# Patient Record
Sex: Female | Born: 1960 | Race: White | Hispanic: No | Marital: Married | Smoking: Never smoker
Health system: Southern US, Community
[De-identification: ages and names within clinical notes are randomized; demographics above are authoritative.]

## PROBLEM LIST (undated history)

## (undated) DIAGNOSIS — F4310 Post-traumatic stress disorder, unspecified: Secondary | ICD-10-CM

## (undated) DIAGNOSIS — F419 Anxiety disorder, unspecified: Secondary | ICD-10-CM

## (undated) DIAGNOSIS — R3915 Urgency of urination: Secondary | ICD-10-CM

## (undated) DIAGNOSIS — E119 Type 2 diabetes mellitus without complications: Secondary | ICD-10-CM

## (undated) DIAGNOSIS — F32A Depression, unspecified: Secondary | ICD-10-CM

## (undated) DIAGNOSIS — N3010 Interstitial cystitis (chronic) without hematuria: Secondary | ICD-10-CM

## (undated) DIAGNOSIS — I10 Essential (primary) hypertension: Secondary | ICD-10-CM

## (undated) DIAGNOSIS — F319 Bipolar disorder, unspecified: Secondary | ICD-10-CM

## (undated) DIAGNOSIS — F329 Major depressive disorder, single episode, unspecified: Secondary | ICD-10-CM

## (undated) DIAGNOSIS — R350 Frequency of micturition: Secondary | ICD-10-CM

## (undated) HISTORY — PX: COLONOSCOPY: SHX174

## (undated) HISTORY — PX: BLADDER SURGERY: SHX569

---

## 2003-05-03 ENCOUNTER — Other Ambulatory Visit: Admission: RE | Admit: 2003-05-03 | Discharge: 2003-05-03 | Payer: Self-pay | Admitting: Internal Medicine

## 2003-06-29 ENCOUNTER — Encounter: Admission: RE | Admit: 2003-06-29 | Discharge: 2003-06-29 | Payer: Self-pay | Admitting: Internal Medicine

## 2003-06-29 ENCOUNTER — Encounter: Payer: Self-pay | Admitting: Internal Medicine

## 2003-09-24 ENCOUNTER — Encounter (INDEPENDENT_AMBULATORY_CARE_PROVIDER_SITE_OTHER): Payer: Self-pay | Admitting: Specialist

## 2003-09-24 ENCOUNTER — Ambulatory Visit (HOSPITAL_BASED_OUTPATIENT_CLINIC_OR_DEPARTMENT_OTHER): Admission: RE | Admit: 2003-09-24 | Discharge: 2003-09-24 | Payer: Self-pay | Admitting: Urology

## 2003-09-24 ENCOUNTER — Ambulatory Visit (HOSPITAL_COMMUNITY): Admission: RE | Admit: 2003-09-24 | Discharge: 2003-09-24 | Payer: Self-pay | Admitting: Urology

## 2004-10-11 ENCOUNTER — Other Ambulatory Visit: Admission: RE | Admit: 2004-10-11 | Discharge: 2004-10-11 | Payer: Self-pay | Admitting: Internal Medicine

## 2004-10-31 ENCOUNTER — Encounter: Admission: RE | Admit: 2004-10-31 | Discharge: 2004-10-31 | Payer: Self-pay | Admitting: Internal Medicine

## 2005-11-20 ENCOUNTER — Other Ambulatory Visit: Admission: RE | Admit: 2005-11-20 | Discharge: 2005-11-20 | Payer: Self-pay | Admitting: Obstetrics and Gynecology

## 2005-12-25 ENCOUNTER — Encounter: Admission: RE | Admit: 2005-12-25 | Discharge: 2005-12-25 | Payer: Self-pay | Admitting: Internal Medicine

## 2006-05-17 ENCOUNTER — Ambulatory Visit: Payer: Self-pay | Admitting: Gastroenterology

## 2007-01-28 ENCOUNTER — Other Ambulatory Visit: Admission: RE | Admit: 2007-01-28 | Discharge: 2007-01-28 | Payer: Self-pay | Admitting: Obstetrics and Gynecology

## 2007-02-14 ENCOUNTER — Encounter: Admission: RE | Admit: 2007-02-14 | Discharge: 2007-02-14 | Payer: Self-pay | Admitting: Obstetrics and Gynecology

## 2008-03-10 ENCOUNTER — Encounter: Admission: RE | Admit: 2008-03-10 | Discharge: 2008-03-10 | Payer: Self-pay | Admitting: Obstetrics and Gynecology

## 2009-04-27 ENCOUNTER — Encounter: Admission: RE | Admit: 2009-04-27 | Discharge: 2009-04-27 | Payer: Self-pay | Admitting: Internal Medicine

## 2010-04-28 ENCOUNTER — Encounter: Admission: RE | Admit: 2010-04-28 | Discharge: 2010-04-28 | Payer: Self-pay | Admitting: Internal Medicine

## 2011-05-18 NOTE — Op Note (Signed)
   NAME:  Loretta Wu, Loretta Wu                        ACCOUNT NO.:  [id number]   MEDICAL RECORD NO.:  [id number]                   PATIENT TYPE:  AMB   LOCATION:  [facility]                                 FACILITY:  [facility]   PHYSICIAN:  Sigmund I. Patsi Sears, M.D.         DATE OF BIRTH:  [birth year]   DATE OF PROCEDURE:  09/24/2003  DATE OF DISCHARGE:                                 OPERATIVE REPORT   PREOPERATIVE DIAGNOSIS:  Interstitial cystitis.   POSTOPERATIVE DIAGNOSIS:  Interstitial cystitis.   OPERATION:  1. Cystourethroscopy.  2. Hydrodistention of the bladder (350 mL).  3. Cold cup bladder biopsies with cauterization.  4. Instillation of Clorpactin, five minutes.  5. Instillation of Pyridium/Marcaine solution and injection of     Marcaine/Kenalog solution, B&O suppository, IV Toradol.   SURGEON:  Sigmund I. Patsi Sears, M.D.   ASSISTANT:  Terri Piedra, N.P.   PREPARATION:  After appropriate preanesthesia, the patient is brought to the  operating room and placed on the operating table in the dorsal supine  position where general LMA anesthesia was introduced.  She was then re-  placed in the dorsal lithotomy position where the pubis was prepped with  Betadine solution and draped in the usual fashion.   DESCRIPTION OF PROCEDURE:  Cystourethroscopy was accomplished using the 21  French cystoscope.  Hydrodistention was accomplished to 350 mL, and  cystoscopy showed multiple ulcerations within the bladder, which were  photodocumented.  Biopsies were taken of the bladder, and the areas  cauterized.  Following this, Clorpactin was inserted for five minutes and  following this, the Clorpactin was drained, and Pyridium/Marcaine solution  was placed in the bladder, and Marcaine/Kenalog solution was injected into  the subtrigonal space.  She was given a B&O suppository prior to the  beginning of the procedure and IV Toradol at the end of the procedure.  She  was awakened and taken to the  recovery room in good condition.                                               Sigmund I. Patsi Sears, M.D.    SIT/MEDQ  D:  09/24/2003  T:  09/24/2003  Job:  [id number]

## 2012-01-07 ENCOUNTER — Other Ambulatory Visit: Payer: Self-pay | Admitting: Internal Medicine

## 2012-01-07 DIAGNOSIS — Z1231 Encounter for screening mammogram for malignant neoplasm of breast: Secondary | ICD-10-CM

## 2012-01-11 ENCOUNTER — Ambulatory Visit: Payer: Self-pay

## 2012-02-01 ENCOUNTER — Ambulatory Visit: Payer: Self-pay

## 2012-03-07 ENCOUNTER — Ambulatory Visit: Payer: Self-pay

## 2012-04-15 ENCOUNTER — Ambulatory Visit
Admission: RE | Admit: 2012-04-15 | Discharge: 2012-04-15 | Disposition: A | Discharge status: Home / Self Care | Payer: 59 | Source: Ambulatory Visit | Attending: Internal Medicine | Admitting: Internal Medicine

## 2012-04-15 DIAGNOSIS — Z1231 Encounter for screening mammogram for malignant neoplasm of breast: Secondary | ICD-10-CM

## 2012-05-16 ENCOUNTER — Other Ambulatory Visit: Payer: Self-pay | Admitting: *Deleted

## 2012-05-16 ENCOUNTER — Other Ambulatory Visit (HOSPITAL_COMMUNITY)
Admission: RE | Admit: 2012-05-16 | Discharge: 2012-05-16 | Disposition: A | Discharge status: Home / Self Care | Payer: 59 | Source: Ambulatory Visit | Attending: Internal Medicine | Admitting: Internal Medicine

## 2012-05-16 DIAGNOSIS — Z1159 Encounter for screening for other viral diseases: Secondary | ICD-10-CM | POA: Insufficient documentation

## 2012-05-16 DIAGNOSIS — Z01419 Encounter for gynecological examination (general) (routine) without abnormal findings: Secondary | ICD-10-CM | POA: Insufficient documentation

## 2012-05-17 ENCOUNTER — Encounter (HOSPITAL_BASED_OUTPATIENT_CLINIC_OR_DEPARTMENT_OTHER): Payer: Self-pay | Admitting: *Deleted

## 2012-05-17 ENCOUNTER — Emergency Department (HOSPITAL_BASED_OUTPATIENT_CLINIC_OR_DEPARTMENT_OTHER)
Admission: EM | Admit: 2012-05-17 | Discharge: 2012-05-17 | Disposition: A | Discharge status: Home / Self Care | Payer: 59 | Attending: Emergency Medicine | Admitting: Emergency Medicine

## 2012-05-17 DIAGNOSIS — F329 Major depressive disorder, single episode, unspecified: Secondary | ICD-10-CM | POA: Insufficient documentation

## 2012-05-17 DIAGNOSIS — L02419 Cutaneous abscess of limb, unspecified: Secondary | ICD-10-CM | POA: Insufficient documentation

## 2012-05-17 DIAGNOSIS — I10 Essential (primary) hypertension: Secondary | ICD-10-CM | POA: Insufficient documentation

## 2012-05-17 DIAGNOSIS — F3289 Other specified depressive episodes: Secondary | ICD-10-CM | POA: Insufficient documentation

## 2012-05-17 DIAGNOSIS — L0291 Cutaneous abscess, unspecified: Secondary | ICD-10-CM

## 2012-05-17 HISTORY — DX: Depression, unspecified: F32.A

## 2012-05-17 HISTORY — DX: Essential (primary) hypertension: I10

## 2012-05-17 HISTORY — DX: Urgency of urination: R39.15

## 2012-05-17 HISTORY — DX: Major depressive disorder, single episode, unspecified: F32.9

## 2012-05-17 HISTORY — DX: Anxiety disorder, unspecified: F41.9

## 2012-05-17 HISTORY — DX: Frequency of micturition: R35.0

## 2012-05-17 HISTORY — DX: Interstitial cystitis (chronic) without hematuria: N30.10

## 2012-05-17 MED ORDER — SULFAMETHOXAZOLE-TRIMETHOPRIM 800-160 MG PO TABS: 1.0000 | ORAL_TABLET | Freq: Two times a day (BID) | ORAL | Status: AC

## 2012-05-17 MED ORDER — LIDOCAINE HCL 2 % IJ SOLN
INTRAMUSCULAR | Status: AC
  Administered 2012-05-17: 23:00:00
  Filled 2012-05-17: qty 1

## 2012-05-17 MED ORDER — HYDROCODONE-ACETAMINOPHEN 5-500 MG PO TABS: 1.0000 | ORAL_TABLET | Freq: Four times a day (QID) | ORAL | Status: AC | PRN

## 2012-05-17 NOTE — Discharge Instructions (Signed)
Keep area very clean. Take motrin as need. Take antibiotic as prescribed.  You may also take vicodin as need for pain. No driving  when taking vicodin. Also, do not take tylenol or acetaminophen containing medication when taking vicodin. Follow up with primary care doctor, or here, Monday morning for recheck of wound and possible packing removal. Return to ER right away if worse, spreading redness, severe pain, high fevers, other concern.   Your blood pressure is high today - follow up with primary care doctor for recheck in coming week.       Abscess An abscess (boil or furuncle) is an infected area that contains a collection of pus.  SYMPTOMS Signs and symptoms of an abscess include pain, tenderness, redness, or hardness. You may feel a moveable soft area under your skin. An abscess can occur anywhere in the body.  TREATMENT  A surgical cut (incision) may be made over your abscess to drain the pus. Gauze may be packed into the space or a drain may be looped through the abscess cavity (pocket). This provides a drain that will allow the cavity to heal from the inside outwards. The abscess may be painful for a few days, but should feel much better if it was drained.  Your abscess, if seen early, may not have localized and may not have been drained. If not, another appointment may be required if it does not get better on its own or with medications. HOME CARE INSTRUCTIONS   Only take over-the-counter or prescription medicines for pain, discomfort, or fever as directed by your caregiver.   Take your antibiotics as directed if they were prescribed. Finish them even if you start to feel better.   Keep the skin and clothes clean around your abscess.   If the abscess was drained, you will need to use gauze dressing to collect any draining pus. Dressings will typically need to be changed 3 or more times a day.   The infection may spread by skin contact with others. Avoid skin contact as much as  possible.   Practice good hygiene. This includes regular hand washing, cover any draining skin lesions, and do not share personal care items.   If you participate in sports, do not share athletic equipment, towels, whirlpools, or personal care items. Shower after every practice or tournament.   If a draining area cannot be adequately covered:   Do not participate in sports.   Children should not participate in day care until the wound has healed or drainage stops.   If your caregiver has given you a follow-up appointment, it is very important to keep that appointment. Not keeping the appointment could result in a much worse infection, chronic or permanent injury, pain, and disability. If there is any problem keeping the appointment, you must call back to this facility for assistance.  SEEK MEDICAL CARE IF:   You develop increased pain, swelling, redness, drainage, or bleeding in the wound site.   You develop signs of generalized infection including muscle aches, chills, fever, or a general ill feeling.   You have an oral temperature above 102 F (38.9 C).  MAKE SURE YOU:   Understand these instructions.   Will watch your condition.   Will get help right away if you are not doing well or get worse.  Document Released: 09/26/2005 Document Revised: 12/06/2011 Document Reviewed: 07/20/2008 [facility] Patient Information 2012 [place], [place].

## 2012-05-17 NOTE — ED Notes (Signed)
Pt presents to ED with an approx 6 in. In diameter raised reddened area to the right upper leg.

## 2012-05-17 NOTE — ED Provider Notes (Signed)
History   This chart was scribed for Suzi Roots, MD by Shari Heritage. The patient was seen in room MH09/MH09. Patient's care was started at 2101.     CSN: [id number]  Arrival date & time 05/17/12  2101   First MD Initiated Contact with Patient 05/17/12 2150      Chief Complaint  Patient presents with  . Insect Bite    (Consider location/radiation/quality/duration/timing/severity/associated sxs/prior treatment) The history is provided by the patient.   Loretta Wu is a 51 y.o. female who presents to the Emergency Department complaining of an mild, dull, persistent pain in right thigh associated with an insect bite to the lateral aspect of the right thigh. Other associated symptoms are headache and erythema around bite site. Patient first noticed the insect bite 2 days ago. Pain worsened today which prompted visit to ED. Patient reported no specific aggravating factors. Pt w/o history of abscesses or boils. Patient with h/o of multiple skin infections that were never drained. Pt saw a nurse practitioner one day ago who prescribed an antibiotic that pt started this morning.  No vomiting or nausea. No edema in right thigh. No cough, congestion, SOB, or rhinorrhea. No fever or chills. Pt is ambulatory.  Pt also with h/o of HTN, anxiety, urgency of urination and frequency of urination.  Past Medical History  Diagnosis Date  . Interstitial cystitis   . Hypertension   . Anxiety   . Depression   . Urgency of urination   . Frequency of urination     Past Surgical History  Procedure Date  . Bladder surgery     History reviewed. No pertinent family history.  History  Substance Use Topics  . Smoking status: Never Smoker   . Smokeless tobacco: Not on file  . Alcohol Use: No    OB History    Grav Para Term Preterm Abortions TAB SAB Ect Mult Living                  Review of Systems  Constitutional: Negative for fever and chills.  HENT: Negative for congestion and  rhinorrhea.   Respiratory: Negative for cough and shortness of breath.   Gastrointestinal: Negative for nausea and vomiting.  Musculoskeletal:       Pain in right thigh.  Skin: Positive for color change (Erythema on right thigh.).  Neurological: Positive for headaches.    Allergies  Review of patient's allergies indicates no known allergies.  Home Medications   Current Outpatient Rx  Name Route Sig Dispense Refill  . ACETAMINOPHEN 500 MG PO TABS Oral Take 500 mg by mouth once. For pain    . AMOXICILLIN-POT CLAVULANATE 875-125 MG PO TABS Oral Take 1 tablet by mouth 2 (two) times daily.    Marland Kitchen CALCIUM + D PO Oral Take 1 tablet by mouth daily.    Marland Kitchen MIRABEGRON ER 50 MG PO TB24 Oral Take by mouth daily.    Marland Kitchen NITROFURANTOIN MACROCRYSTAL 50 MG PO CAPS Oral Take 50 mg by mouth daily.    Marland Kitchen OLMESARTAN MEDOXOMIL 20 MG PO TABS Oral Take 20 mg by mouth daily.    . SERTRALINE HCL 50 MG PO TABS Oral Take 50 mg by mouth daily.      BP 172/80  Pulse 119  Temp(Src) 99.2 F (37.3 C) (Oral)  Resp 20  Ht 5\' 6"  (1.676 m)  Wt 230 lb (104.327 kg)  BMI 37.12 kg/m2  SpO2 100%  LMP 05/17/2010  Physical Exam  Nursing note  and vitals reviewed. Constitutional: She is oriented to person, place, and time. She appears well-developed and well-nourished.  HENT:  Head: Normocephalic and atraumatic.  Eyes: Conjunctivae and EOM are normal. Pupils are equal, round, and reactive to light.  Neck: Normal range of motion. Neck supple.  Cardiovascular: Normal rate and regular rhythm.   Pulmonary/Chest: Effort normal and breath sounds normal.  Abdominal: Soft. Bowel sounds are normal.  Musculoskeletal: Normal range of motion.  Neurological: She is alert and oriented to person, place, and time.  Skin: Skin is warm and dry. There is erythema.       Left erythema to right upper thigh that is 10-12 cm in diameter. Few tiny erythematous lesions less than 1 cm in diameter on left leg.  Psychiatric: She has a normal  mood and affect.    ED Course  Procedures (including critical care time) DIAGNOSTIC STUDIES: Oxygen Saturation is 100% room air, normal by my interpretation.    COORDINATION OF CARE: 10:11PM- Patient informed of current plan for treatment and evaluation and agrees with plan at this time.       MDM  Pt with abscess right lateral/posterior upper leg.    INCISION AND DRAINAGE Performed by: Suzi Roots Consent: Verbal consent obtained. Risks and benefits: risks, benefits and alternatives were discussed Type: abscess  Body area: right upper leg/thigh  Anesthesia: local infiltration  Local anesthetic: lidocaine 2% w epinephrine  Anesthetic total: 7 ml  Complexity: complex Blunt dissection to break up loculations  Drainage: purulent  Drainage amount: large  Packing material: 1/4 in iodoform gauze  Patient tolerance: Patient tolerated the procedure well with no immediate complications.      Sterile dressing. Confirmed nkda w pt.  Bactrim ds.   Recheck hr 92 rr 16 no chills or sweats.              I personally performed the services described in this documentation, which was scribed in my presence. The recorded information has been reviewed and considered. Suzi Roots, MD    Suzi Roots, MD 05/17/12 [phone]

## 2012-05-17 NOTE — ED Notes (Signed)
Pt reports noticing an insect bite to the lateral side of her right thigh. States that she went to her GYN for her annual check up and the PA prescribed her an antibiotic for it yesterday. States that site became larger and more inflamed, so pt came to the ER for further evaluation. SIte is inflamed, hot to the touch with induration at the center. Pt reports having frequent skin infections.

## 2012-05-17 NOTE — ED Notes (Signed)
Dr. Denton Lank at bedside performing I&D on pt's right outer thigh

## 2013-10-30 ENCOUNTER — Other Ambulatory Visit: Payer: Self-pay

## 2013-10-30 DIAGNOSIS — Z1231 Encounter for screening mammogram for malignant neoplasm of breast: Secondary | ICD-10-CM

## 2013-12-02 ENCOUNTER — Ambulatory Visit
Admission: RE | Admit: 2013-12-02 | Discharge: 2013-12-02 | Disposition: A | Discharge status: Home / Self Care | Payer: 59 | Source: Ambulatory Visit

## 2013-12-02 DIAGNOSIS — Z1231 Encounter for screening mammogram for malignant neoplasm of breast: Secondary | ICD-10-CM

## 2014-01-11 ENCOUNTER — Other Ambulatory Visit: Payer: Self-pay | Admitting: Dermatology

## 2014-01-11 ENCOUNTER — Other Ambulatory Visit: Payer: 59

## 2014-01-11 ENCOUNTER — Ambulatory Visit
Admission: RE | Admit: 2014-01-11 | Discharge: 2014-01-11 | Disposition: A | Discharge status: Home / Self Care | Payer: 59 | Source: Ambulatory Visit | Attending: Dermatology | Admitting: Dermatology

## 2014-01-11 DIAGNOSIS — I8312 Varicose veins of left lower extremity with inflammation: Secondary | ICD-10-CM

## 2014-01-11 DIAGNOSIS — L539 Erythematous condition, unspecified: Secondary | ICD-10-CM

## 2014-09-23 ENCOUNTER — Emergency Department (HOSPITAL_BASED_OUTPATIENT_CLINIC_OR_DEPARTMENT_OTHER)
Admission: EM | Admit: 2014-09-23 | Discharge: 2014-09-23 | Disposition: A | Discharge status: Home / Self Care | Payer: 59 | Attending: Emergency Medicine | Admitting: Emergency Medicine

## 2014-09-23 ENCOUNTER — Encounter (HOSPITAL_BASED_OUTPATIENT_CLINIC_OR_DEPARTMENT_OTHER): Payer: Self-pay | Admitting: Emergency Medicine

## 2014-09-23 ENCOUNTER — Emergency Department (HOSPITAL_BASED_OUTPATIENT_CLINIC_OR_DEPARTMENT_OTHER): Payer: 59

## 2014-09-23 DIAGNOSIS — Z87448 Personal history of other diseases of urinary system: Secondary | ICD-10-CM | POA: Diagnosis not present

## 2014-09-23 DIAGNOSIS — F329 Major depressive disorder, single episode, unspecified: Secondary | ICD-10-CM | POA: Insufficient documentation

## 2014-09-23 DIAGNOSIS — M5412 Radiculopathy, cervical region: Secondary | ICD-10-CM | POA: Diagnosis not present

## 2014-09-23 DIAGNOSIS — F411 Generalized anxiety disorder: Secondary | ICD-10-CM | POA: Diagnosis not present

## 2014-09-23 DIAGNOSIS — I10 Essential (primary) hypertension: Secondary | ICD-10-CM | POA: Insufficient documentation

## 2014-09-23 DIAGNOSIS — F3289 Other specified depressive episodes: Secondary | ICD-10-CM | POA: Diagnosis not present

## 2014-09-23 DIAGNOSIS — M25519 Pain in unspecified shoulder: Secondary | ICD-10-CM | POA: Diagnosis present

## 2014-09-23 DIAGNOSIS — Z792 Long term (current) use of antibiotics: Secondary | ICD-10-CM | POA: Diagnosis not present

## 2014-09-23 DIAGNOSIS — Z79899 Other long term (current) drug therapy: Secondary | ICD-10-CM | POA: Insufficient documentation

## 2014-09-23 MED ORDER — HYDROCODONE-ACETAMINOPHEN 5-325 MG PO TABS: 1.0000 | ORAL_TABLET | Freq: Four times a day (QID) | ORAL | Status: DC | PRN

## 2014-09-23 MED ORDER — HYDROCODONE-ACETAMINOPHEN 5-325 MG PO TABS
2.0000 | ORAL_TABLET | Freq: Once | ORAL | Status: AC
  Administered 2014-09-23: 2 via ORAL
  Filled 2014-09-23: qty 2

## 2014-09-23 NOTE — ED Notes (Signed)
Pt c/o lt shoulder pain since yesterday, denies injury, states feels like muscle, worse tonight

## 2014-09-23 NOTE — Discharge Instructions (Signed)

## 2014-09-23 NOTE — ED Provider Notes (Addendum)
CSN: [id number]     Arrival date & time 09/23/14  4098 History   First MD Initiated Contact with Patient 09/23/14 0533     Chief Complaint  Patient presents with  . Shoulder Pain     (Consider location/radiation/quality/duration/timing/severity/associated sxs/prior Treatment) HPI This is a 53 year old female with pain in the left neck and shoulder since about 2 PM yesterday afternoon. She denies injury or lifting as a trigger. The pain is located in the left side of the neck radiating across the back of the shoulder laterally and inferiorly. The pain is sharp and moderate to severe at rest. It is worse with rotation of the head to the left, flexion of the head to the left or palpation of the left neck or posterior shoulder. She has had some radiation to the left deltoid region with occasional paresthesias of the left deltoid region. She denies numbness or weakness of the distal left upper extremity.  Past Medical History  Diagnosis Date  . Interstitial cystitis   . Hypertension   . Anxiety   . Depression   . Urgency of urination   . Frequency of urination    Past Surgical History  Procedure Laterality Date  . Bladder surgery     No family history on file. History  Substance Use Topics  . Smoking status: Never Smoker   . Smokeless tobacco: Not on file  . Alcohol Use: No   OB History   Grav Para Term Preterm Abortions TAB SAB Ect Mult Living                 Review of Systems  All other systems reviewed and are negative.   Allergies  Review of patient's allergies indicates no known allergies.  Home Medications   Prior to Admission medications   Medication Sig Start Date End Date Taking? Authorizing Provider  acetaminophen (TYLENOL) 500 MG tablet Take 500 mg by mouth once. For pain   Yes Historical Provider, MD  Calcium Carbonate-Vitamin D (CALCIUM + D PO) Take 1 tablet by mouth daily.   Yes Historical Provider, MD  nitrofurantoin (MACRODANTIN) 50 MG capsule Take 50 mg  by mouth daily.   Yes Historical Provider, MD  sertraline (ZOLOFT) 50 MG tablet Take 50 mg by mouth daily.   Yes Historical Provider, MD  valsartan (DIOVAN) 160 MG tablet Take 160 mg by mouth daily.   Yes Historical Provider, MD  amoxicillin-clavulanate (AUGMENTIN) 875-125 MG per tablet Take 1 tablet by mouth 2 (two) times daily.    Historical Provider, MD  mirabegron ER (MYRBETRIQ) 50 MG TB24 Take by mouth daily.    Historical Provider, MD  olmesartan (BENICAR) 20 MG tablet Take 20 mg by mouth daily.    Historical Provider, MD   BP 170/85  Pulse 84  Temp(Src) 98.7 F (37.1 C) (Oral)  Resp 20  Ht 5' 6.5" (1.689 m)  Wt 249 lb (112.946 kg)  BMI 39.59 kg/m2  SpO2 100%  LMP 05/17/2010  Physical Exam General: Well-developed, well-nourished female in no acute distress; appearance consistent with age of record HENT: normocephalic; atraumatic Eyes: pupils equal, round and reactive to light; extraocular muscles intact Neck: supple; pain on rotation of head to the left, unable to flex head to the left due to pain, soft tissue tenderness of left side of neck and left trapezius Heart: regular rate and rhythm Lungs: clear to auscultation bilaterally Abdomen: soft; nondistended Extremities: No deformity; full range of motion; pulses normal Neurologic: Awake, alert and oriented; motor function  intact in all extremities and symmetric; sensation intact and symmetric in upper extremities; no facial droop Skin: Warm and dry Psychiatric: Normal mood and affect    ED Course  Procedures (including critical care time)  MDM  Nursing notes and vitals signs, including pulse oximetry, reviewed.  Summary of this visit's results, reviewed by myself:  Imaging Studies: Dg Shoulder Left  09/23/2014   CLINICAL DATA:  Left shoulder pain.  No known injury.  EXAM: LEFT SHOULDER - 2+ VIEW  COMPARISON:  None.  FINDINGS: There is no evidence of fracture or dislocation. There is no evidence of arthropathy or  other focal bone abnormality. Soft tissues are unremarkable.  IMPRESSION: Negative.   Electronically Signed   By: Tiburcio Pea M.D.   On: 09/23/2014 05:54      History and exam consistent with a left cervical radiculopathy. She was advised to take over-the-counter NSAID such as ibuprofen or naproxen sodium and we will provide a short course of narcotics.    Hanley Seamen, MD 09/23/14 1610  Hanley Seamen, MD 09/23/14 9604  Hanley Seamen, MD 09/23/14 [phone]

## 2014-10-27 ENCOUNTER — Ambulatory Visit: Payer: 59 | Attending: Internal Medicine | Admitting: Physical Therapy

## 2014-10-27 DIAGNOSIS — M5412 Radiculopathy, cervical region: Secondary | ICD-10-CM | POA: Insufficient documentation

## 2014-10-27 DIAGNOSIS — M419 Scoliosis, unspecified: Secondary | ICD-10-CM | POA: Diagnosis not present

## 2014-10-27 DIAGNOSIS — Z5189 Encounter for other specified aftercare: Secondary | ICD-10-CM | POA: Diagnosis present

## 2014-10-27 DIAGNOSIS — I10 Essential (primary) hypertension: Secondary | ICD-10-CM | POA: Diagnosis not present

## 2014-11-04 ENCOUNTER — Other Ambulatory Visit: Payer: Self-pay

## 2014-11-04 DIAGNOSIS — Z1231 Encounter for screening mammogram for malignant neoplasm of breast: Secondary | ICD-10-CM

## 2014-11-09 ENCOUNTER — Ambulatory Visit: Payer: 59 | Attending: Internal Medicine | Admitting: Rehabilitation

## 2014-11-09 ENCOUNTER — Telehealth: Payer: Self-pay | Admitting: Physical Therapy

## 2014-11-09 DIAGNOSIS — I10 Essential (primary) hypertension: Secondary | ICD-10-CM | POA: Insufficient documentation

## 2014-11-09 DIAGNOSIS — M419 Scoliosis, unspecified: Secondary | ICD-10-CM | POA: Insufficient documentation

## 2014-11-09 DIAGNOSIS — Z5189 Encounter for other specified aftercare: Secondary | ICD-10-CM | POA: Insufficient documentation

## 2014-11-09 DIAGNOSIS — M5412 Radiculopathy, cervical region: Secondary | ICD-10-CM | POA: Insufficient documentation

## 2014-11-09 NOTE — Telephone Encounter (Signed)
Left message for patient to call and cancel future appointments if she does not plan to attend P.T. Also confirmed next appointment date/time.

## 2014-11-15 ENCOUNTER — Ambulatory Visit: Payer: 59 | Admitting: Physical Therapy

## 2014-11-17 ENCOUNTER — Ambulatory Visit: Payer: 59 | Admitting: Rehabilitation

## 2014-11-17 DIAGNOSIS — M5412 Radiculopathy, cervical region: Secondary | ICD-10-CM | POA: Diagnosis not present

## 2014-11-17 DIAGNOSIS — M419 Scoliosis, unspecified: Secondary | ICD-10-CM | POA: Diagnosis not present

## 2014-11-17 DIAGNOSIS — Z5189 Encounter for other specified aftercare: Secondary | ICD-10-CM | POA: Diagnosis present

## 2014-11-17 DIAGNOSIS — I10 Essential (primary) hypertension: Secondary | ICD-10-CM | POA: Diagnosis not present

## 2014-11-17 NOTE — Therapy (Signed)
Physical Therapy Treatment  Patient Details  Name: Loretta Wu MRN: [id number] Date of Birth: [birth year]  Encounter Date: 11/17/2014      PT End of Session - 11/17/14 0819    Visit Number 2   Number of Visits 8   Date for PT Re-Evaluation 12/08/14   PT Start Time 0807   PT Stop Time 0859   PT Time Calculation (min) 52 min      Past Medical History  Diagnosis Date  . Interstitial cystitis   . Hypertension   . Anxiety   . Depression   . Urgency of urination   . Frequency of urination     Past Surgical History  Procedure Laterality Date  . Bladder surgery      LMP 05/17/2010  Visit Diagnosis:  Cervical radiculopathy      Subjective Assessment - 11/17/14 0815    Symptoms Pain varies, 7-8/10 at worst, least 5-6/10. Pt reports taking less meds and is able to sleep better last few days using icy hot.    Currently in Pain? Yes   Pain Score 8    Pain Location Shoulder  posterior shoulder   Pain Orientation Left   Pain Descriptors / Indicators Aching   Pain Onset More than a month ago   Pain Frequency Constant   Aggravating Factors  Sitting still    Pain Relieving Factors pain meds, icy hot   Multiple Pain Sites No            [facility] Adult PT Treatment/Exercise - 11/17/14 0931    Neck Exercises: Stretches   Upper Trapezius Stretch 3 reps;10 seconds   Levator Stretch 3 reps;10 seconds   Neck Exercises: Seated   Neck Retraction 10 reps;5 secs   Shoulder Exercises: Seated   Retraction 10 reps  5 seconds   Modalities   Modalities Electrical Stimulation;Moist Heat   Moist Heat Therapy   Number Minutes Moist Heat 15 Minutes   Moist Heat Location Other (comment)  upper trap   Electrical Stimulation   Electrical Stimulation Location --  upper trap/ levator left   Electrical Stimulation Action --  IFC   Electrical Stimulation Goals Pain   Manual Therapy   Manual Therapy Massage   Massage --  Soft tissue work to left levator, teres, traps          PT Education - 11/17/14 [phone]    Education provided Yes   Education Details Self Care: Sittinf posture and affect it has on pain discussed in detail with patient. Reasoning behind exercises and importance of HEP compliance to maximize results of treatment.   Person(s) Educated Patient   Methods Explanation;Demonstration   Comprehension Verbalized understanding            PT Long Term Goals - 11/17/14 0835    PT LONG TERM GOAL #1   Title demonstrate and or verbalize techniqes to reduce the risk of re-injury to include info on: posture and body mechanics   Time 4   Period Weeks   Status On-going   PT LONG TERM GOAL #2   Title be independent with advanced hep for posture and neck flexibility   Time 4   Period Weeks   Status On-going   PT LONG TERM GOAL #3   Title sit as long as needed with min occ pain Lt. 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 No goals met, limited visit number. Response to todays treatment: pain reduce to 5-6/10 after electrical stim.   PT Next Visit Plan Issue TENS if patient reports benefit, taction trial? (patient does not like to lay down), corner stretch        Problem List There are no active problems to display for this patient.                                             Dorene Ar , PTA  11/17/2014, 9:45 AM

## 2014-11-17 NOTE — Patient Instructions (Signed)
Continue Current HEP for Neck tension exercises. Perform at least 2 times per day. Continue to attempt good posture with job duties.

## 2014-11-22 ENCOUNTER — Ambulatory Visit: Payer: 59 | Admitting: Physical Therapy

## 2014-11-24 ENCOUNTER — Encounter: Payer: 59 | Admitting: Rehabilitation

## 2014-12-03 ENCOUNTER — Ambulatory Visit
Admission: RE | Admit: 2014-12-03 | Discharge: 2014-12-03 | Disposition: A | Discharge status: Home / Self Care | Payer: 59 | Source: Ambulatory Visit

## 2014-12-03 ENCOUNTER — Ambulatory Visit: Payer: 59 | Attending: Internal Medicine | Admitting: Physical Therapy

## 2014-12-03 DIAGNOSIS — M419 Scoliosis, unspecified: Secondary | ICD-10-CM | POA: Diagnosis not present

## 2014-12-03 DIAGNOSIS — I10 Essential (primary) hypertension: Secondary | ICD-10-CM | POA: Insufficient documentation

## 2014-12-03 DIAGNOSIS — Z1231 Encounter for screening mammogram for malignant neoplasm of breast: Secondary | ICD-10-CM

## 2014-12-03 DIAGNOSIS — M5412 Radiculopathy, cervical region: Secondary | ICD-10-CM | POA: Insufficient documentation

## 2014-12-03 DIAGNOSIS — Z5189 Encounter for other specified aftercare: Secondary | ICD-10-CM | POA: Diagnosis not present

## 2014-12-03 NOTE — Therapy (Signed)
[facility] [address] [place], [place], [address] Phone: [phone]   Fax:  [phone]  Physical Therapy Treatment  Patient Details  Name: Isabella StallingKaren R Jaskiewicz MRN: [id number] Date of Birth: 11/05/1961  Encounter Date: 12/03/2014      PT End of Session - 12/03/14 1149    Visit Number 3   Number of Visits 8   Date for PT Re-Evaluation 12/08/14   PT Start Time 1112   Activity Tolerance Patient limited by pain      Past Medical History  Diagnosis Date  . Interstitial cystitis   . Hypertension   . Anxiety   . Depression   . Urgency of urination   . Frequency of urination     Past Surgical History  Procedure Laterality Date  . Bladder surgery      LMP 05/17/2010  Visit Diagnosis:  Cervical radiculopathy      Subjective Assessment - 12/03/14 1113    Symptoms Patient arrives 10 min late.  E-stim and heat helped for several hours last time.  States neck and UE symptoms are pretty much constant.     Currently in Pain? Yes   Pain Score 4    Pain Location Neck   Pain Orientation Left   Pain Type Chronic pain   Pain Radiating Towards --  Left shoulder, arm, index and middle finger numbness   Pain Onset More than a month ago   Aggravating Factors  Left sidelying;  reading on Kindle device;  prolonged sitting   Pain Relieving Factors e-stim;  heat;     Multiple Pain Sites No            [facility] Adult PT Treatment/Exercise - 12/03/14 1119    Neck Exercises: Seated   Neck Retraction 10 reps  Needs verbal cues to perform correctly.   Lateral Flexion Both;5 reps   Other Seated Exercise --  cervical retraction/extensions 5x   Moist Heat Therapy   Number Minutes Moist Heat 15 Minutes   Moist Heat Location --  left neck/shoulder   Electrical Stimulation   Electrical Stimulation Location --  left neck, medial scapula, upper trap and deltoid regions   Electrical Stimulation Action --  IFC   Electrical Stimulation Parameters  --  7 ma x 15 min   Electrical Stimulation Goals Pain   Manual Therapy   Manual Therapy Myofascial release   Myofascial Release --  bilateral upper trap,levator, rhomboid, deltoid soft tissue           PT Education - 12/03/14 1149    Education provided Yes   Education Details myofascial pain/dry needling handouts;  cervical retractions, extensions, sidebending   Person(s) Educated Patient   Methods Explanation;Handout   Comprehension Verbalized understanding            PT Long Term Goals - 12/03/14 1227    PT LONG TERM GOAL #1   Title demonstrate and or verbalize techniqes to reduce the risk of re-injury to include info on: posture and body mechanics   Time 2   Period Weeks   Status On-going   PT LONG TERM GOAL #2   Title be independent with advanced hep for posture and neck flexibility   Time 2   Period Weeks   Status On-going   PT LONG TERM GOAL #3   Title sit as long as needed with min occ pain Lt. UE   Time 2   Period Weeks   Status On-going   PT LONG TERM GOAL #  4   Title Sleep through the night without waking from pain   Time 2   Period Weeks   Status On-going          Plan - 12/03/14 1151    Clinical Impression Statement Patient with myofascial pain in neck and left UE although the primary pain is most likely cervical radiculpathy.  Patient is very fearful of movement and not receptive to some interventions used with cervical radiculopathy including traction (manually, over the door or mechanical).  No progress toward goals at this point but limited visits since starting PT October 28.  Will further re-assess ROM next visit and progress toward goals, if progressing wil re-certify.  If no improvement will discharge from PT.   Pt will benefit from skilled therapeutic intervention in order to improve on the following deficits Pain;Increased muscle spasms;Decreased range of motion   Rehab Potential Good   PT Frequency 2x / week   PT Duration 2 weeks   PT  Treatment/Interventions Therapeutic exercise;ADLs/Self Care Home Management;Cryotherapy;Electrical Stimulation;Moist Heat;Patient/family education;Manual techniques;Traction   PT Next Visit Plan Patient using her husband's TENS unit;  patient not interested in traction;  assess carryover with cervical retractions, extensions, right and left sidebending for a directional preference;  reassess cervical ROM;  do FOTO                               Problem List There are no active problems to display for this patient.   Lavinia SharpsSimpson, Stacy C 12/03/2014, 12:30 PM  Lavinia SharpsStacy Simpson, PT 12/03/2014 12:30 PM Phone: [phone] Fax: [phone]

## 2014-12-03 NOTE — Patient Instructions (Addendum)
Patient requests info on "trigger point therapy".  Discussed the physiology and effect of trigger points and myofascial pain.  Provided patient with handouts and she would like to research and consider dry needling for future.  Instructed in centralization theory and trial of cervical retractions, retraction/extensions and sidebending to establish directional preference.

## 2014-12-10 ENCOUNTER — Ambulatory Visit: Payer: 59 | Admitting: Physical Therapy

## 2014-12-10 DIAGNOSIS — M5412 Radiculopathy, cervical region: Secondary | ICD-10-CM

## 2014-12-10 DIAGNOSIS — Z5189 Encounter for other specified aftercare: Secondary | ICD-10-CM | POA: Diagnosis not present

## 2014-12-10 NOTE — Therapy (Signed)
[facility] [address] [place], [place], [address] Phone: [phone]   Fax:  [phone]  Physical Therapy Treatment  Patient Details  Name: Loretta Wu MRN: [id number] Date of Birth: 11/15/1961  Encounter Date: 12/10/2014      PT End of Session - 12/10/14 1114    Visit Number 4   Number of Visits 8   Date for PT Re-Evaluation 12/08/14   PT Start Time 0931   PT Stop Time 1020   PT Time Calculation (min) 49 min   Activity Tolerance Patient limited by pain      Past Medical History  Diagnosis Date  . Interstitial cystitis   . Hypertension   . Anxiety   . Depression   . Urgency of urination   . Frequency of urination     Past Surgical History  Procedure Laterality Date  . Bladder surgery      LMP 05/17/2010  Visit Diagnosis:  Cervical radiculopathy      Subjective Assessment - 12/10/14 0934    Symptoms (p) Had bilateral shoulder trigger point injections late yesterday afternoon and feels worse today and more stiff.   I'll go back Monday twice a week for 12 weeks.  They plan to do the  injections every time.  I also had x-rays which showed nerve compression and spurs.  I did some exercises there too which hurt and gave me a headache.  They also did "chiropractic stuff".     How long can you sit comfortably? (p) 1 hour   How long can you stand comfortably? (p) 30 min   How long can you walk comfortably? (p) 300-400 feet   Diagnostic tests (p) had x-rays which showed nerve compression and bone spurs   Currently in Pain? (p) Yes   Pain Score (p) 8    Pain Location (p) Neck   Pain Orientation (p) Right   Pain Descriptors / Indicators (p) Sore   Pain Type (p) Chronic pain   Aggravating Factors  (p) reading on Kindle device; prolonged sitting   Pain Relieving Factors (p) heat; TENS          [facility] PT Assessment - 12/10/14 1020    Observation/Other Assessments   Focus on Therapeutic Outcomes (FOTO)  --  Worsening  from 44% to 46%   AROM   Cervical Flexion 60   Cervical Extension 45   Cervical - Right Side Bend 34   Cervical - Left Side Bend 34   Cervical - Right Rotation 40   Cervical - Left Rotation 38          [facility] Adult PT Treatment/Exercise - 12/10/14 1018    Moist Heat Therapy   Number Minutes Moist Heat 15 Minutes   Moist Heat Location --  neck   Electrical Stimulation   Electrical Stimulation Location --  neck   Electrical Stimulation Action --  IFC   Electrical Stimulation Parameters 5 ma 15 min   Electrical Stimulation Goals Pain   Manual Therapy   Manual Therapy Myofascial release   Myofascial Release --  bilateral upper traps, levator scaps (seated)              PT Long Term Goals - 12/10/14 1126    PT LONG TERM GOAL #1   Title demonstrate and or verbalize techniqes to reduce the risk of re-injury to include info on: posture and body mechanics   Status Achieved   PT LONG TERM GOAL #2   Title be independent   with advanced hep for posture and neck flexibility   Status Not Met   PT LONG TERM GOAL #3   Title sit as long as needed with min occ pain Lt. UE   Status Partially Met   PT LONG TERM GOAL #4   Title Sleep through the night without waking from pain   Status Not Met          Plan - 12/10/14 1115    Clinical Impression Statement The patient made limited to no progress with PT over 4 visits.  No improvement in cervical sidebend or rotation ROM and no improvement in functional outcome score.  She notes that she thought she was a little better until she had trigger point injections yesterday afternoon.   As expected, she has increased soreness and stiffness today.  In our discussions, she states that she will be receiving multiple interventions  at the physical medicine clinic possibly including PT.  In order to avoid duplication of services, will discharge from PT at this time with limited progress toward goals.                 PHYSICAL THERAPY  DISCHARGE SUMMARY  Visits from Start of Care: 4  Current functional level related to goals / functional outcomes: Patient continues to work.  See clinical impression above  Remaining deficits: See clinical impression above   Education / Equipment: Home TENS unit (husband's);  Low-level ROM and postural strengthening HEP Plan: Patient agrees to discharge.  Patient goals were not met. Patient is being discharged due to lack of progress.  ?????               Stacy Simpson, PT 12/10/2014 11:32 AM Phone: [phone] Fax: [phone]         Problem List There are no active problems to display for this patient.   Simpson, Stacy C 12/10/2014, 11:28 AM      

## 2015-12-09 ENCOUNTER — Ambulatory Visit
Admission: RE | Admit: 2015-12-09 | Discharge: 2015-12-09 | Disposition: A | Discharge status: Home / Self Care | Payer: 59 | Source: Ambulatory Visit | Attending: Internal Medicine | Admitting: Internal Medicine

## 2015-12-09 ENCOUNTER — Other Ambulatory Visit: Payer: Self-pay | Admitting: Internal Medicine

## 2015-12-09 ENCOUNTER — Other Ambulatory Visit (HOSPITAL_COMMUNITY)
Admission: RE | Admit: 2015-12-09 | Discharge: 2015-12-09 | Disposition: A | Discharge status: Home / Self Care | Payer: 59 | Source: Ambulatory Visit | Attending: Internal Medicine | Admitting: Internal Medicine

## 2015-12-09 DIAGNOSIS — Z01419 Encounter for gynecological examination (general) (routine) without abnormal findings: Secondary | ICD-10-CM | POA: Insufficient documentation

## 2015-12-09 DIAGNOSIS — Z1151 Encounter for screening for human papillomavirus (HPV): Secondary | ICD-10-CM | POA: Insufficient documentation

## 2015-12-09 DIAGNOSIS — M542 Cervicalgia: Secondary | ICD-10-CM

## 2015-12-13 LAB — CYTOLOGY - PAP

## 2015-12-30 ENCOUNTER — Other Ambulatory Visit: Payer: Self-pay

## 2015-12-30 DIAGNOSIS — Z1231 Encounter for screening mammogram for malignant neoplasm of breast: Secondary | ICD-10-CM

## 2016-01-06 ENCOUNTER — Other Ambulatory Visit: Payer: Self-pay | Admitting: Obstetrics & Gynecology

## 2016-01-27 ENCOUNTER — Ambulatory Visit
Admission: RE | Admit: 2016-01-27 | Discharge: 2016-01-27 | Disposition: A | Discharge status: Home / Self Care | Payer: 59 | Source: Ambulatory Visit

## 2016-01-27 DIAGNOSIS — Z1231 Encounter for screening mammogram for malignant neoplasm of breast: Secondary | ICD-10-CM

## 2016-12-29 ENCOUNTER — Encounter (HOSPITAL_BASED_OUTPATIENT_CLINIC_OR_DEPARTMENT_OTHER): Payer: Self-pay | Admitting: Emergency Medicine

## 2016-12-29 ENCOUNTER — Emergency Department (HOSPITAL_BASED_OUTPATIENT_CLINIC_OR_DEPARTMENT_OTHER)
Admission: EM | Admit: 2016-12-29 | Discharge: 2016-12-29 | Disposition: A | Discharge status: Home / Self Care | Payer: 59 | Attending: Emergency Medicine | Admitting: Emergency Medicine

## 2016-12-29 DIAGNOSIS — Z79899 Other long term (current) drug therapy: Secondary | ICD-10-CM | POA: Diagnosis not present

## 2016-12-29 DIAGNOSIS — N611 Abscess of the breast and nipple: Secondary | ICD-10-CM | POA: Diagnosis present

## 2016-12-29 DIAGNOSIS — I10 Essential (primary) hypertension: Secondary | ICD-10-CM | POA: Diagnosis not present

## 2016-12-29 MED ORDER — LIDOCAINE HCL 2 % IJ SOLN
20.0000 mL | Freq: Once | INTRAMUSCULAR | Status: AC
  Administered 2016-12-29: 400 mg via INTRADERMAL
  Filled 2016-12-29: qty 20

## 2016-12-29 MED ORDER — IBUPROFEN 800 MG PO TABS: 800.0000 mg | ORAL_TABLET | Freq: Three times a day (TID) | ORAL | 0 refills | Status: DC | PRN

## 2016-12-29 MED ORDER — SULFAMETHOXAZOLE-TRIMETHOPRIM 800-160 MG PO TABS: 2.0000 | ORAL_TABLET | Freq: Two times a day (BID) | ORAL | 0 refills | Status: AC

## 2016-12-29 MED ORDER — TRAMADOL HCL 50 MG PO TABS: 50.0000 mg | ORAL_TABLET | Freq: Four times a day (QID) | ORAL | 0 refills | Status: DC | PRN

## 2016-12-29 NOTE — ED Triage Notes (Signed)
Reports abscess under left breast since yesterday.

## 2016-12-29 NOTE — Discharge Instructions (Signed)
Return here in 2 days for recheck and packing removal.  Use warm compresses around the area

## 2016-12-29 NOTE — ED Provider Notes (Signed)
[facility] Provider Note   CSN: [id number] Arrival date & time: 12/29/16  1448 By signing my name below, I, Levon HedgerElizabeth Hall, attest that this documentation has been prepared under the direction and in the presence of non-physician practitioner, Ebbie Ridgehris Ivianna Notch, PA-C. Electronically Signed: Levon HedgerElizabeth Hall, Scribe. 12/29/2016. 5:46 PM.   History   Chief Complaint Chief Complaint  Patient presents with  . Abscess   HPI Loretta Wu is a 55 y.o. female who presents to the Emergency Department complaining of mildly painful, worsening lesion under her left breast since yesterday. She notes associated drainage which began today while in the waiting room. No alleviating or modifying factors noted.  No treatments tried PTA. Pt denies any fever and has no other complaints at this time.   The history is provided by the patient. No language interpreter was used.   Past Medical History:  Diagnosis Date  . Anxiety   . Depression   . Frequency of urination   . Hypertension   . Interstitial cystitis   . Urgency of urination     There are no active problems to display for this patient.   Past Surgical History:  Procedure Laterality Date  . BLADDER SURGERY      OB History    No data available      Home Medications    Prior to Admission medications   Medication Sig Start Date End Date Taking? Authorizing Provider  acetaminophen (TYLENOL) 500 MG tablet Take 500 mg by mouth once. For pain    Historical Provider, MD  amoxicillin-clavulanate (AUGMENTIN) 875-125 MG per tablet Take 1 tablet by mouth 2 (two) times daily.    Historical Provider, MD  Calcium Carbonate-Vitamin D (CALCIUM + D PO) Take 1 tablet by mouth daily.    Historical Provider, MD  HYDROcodone-acetaminophen (NORCO) 5-325 MG per tablet Take 1-2 tablets by mouth every 6 (six) hours as needed (for pain). 09/23/14   John Molpus, MD  mirabegron ER (MYRBETRIQ) 50 MG TB24 Take by mouth daily.    Historical Provider, MD   nitrofurantoin (MACRODANTIN) 50 MG capsule Take 50 mg by mouth daily.    Historical Provider, MD  olmesartan (BENICAR) 20 MG tablet Take 20 mg by mouth daily.    Historical Provider, MD  sertraline (ZOLOFT) 50 MG tablet Take 50 mg by mouth daily.    Historical Provider, MD  traMADol (ULTRAM) 50 MG tablet Take 50 mg by mouth every 6 (six) hours as needed.    Historical Provider, MD  valsartan (DIOVAN) 160 MG tablet Take 160 mg by mouth daily.    Historical Provider, MD    Family History History reviewed. No pertinent family history.  Social History Social History  Substance Use Topics  . Smoking status: Never Smoker  . Smokeless tobacco: Never Used  . Alcohol use No    Allergies   Patient has no known allergies.  Review of Systems Review of Systems 10 systems reviewed and all are negative for acute change except as noted in the HPI.   Physical Exam Updated Vital Signs BP 149/75 (BP Location: Left Arm)   Pulse 105   Temp 99.1 F (37.3 C) (Oral)   Resp 18   Ht 5' 6.5" (1.689 m)   Wt 235 lb (106.6 kg)   LMP 05/17/2010   SpO2 96%   BMI 37.36 kg/m   Physical Exam  Constitutional: She is oriented to person, place, and time. She appears well-developed and well-nourished. No distress.  HENT:  Head:  Normocephalic and atraumatic.  Eyes: Pupils are equal, round, and reactive to light.  Pulmonary/Chest: Effort normal.  Neurological: She is alert and oriented to person, place, and time.  Skin: Skin is warm and dry.  Abscess on lower left lateral breast with drainage. No erythema.   Psychiatric: She has a normal mood and affect.  Nursing note and vitals reviewed.  ED Treatments / Results  DIAGNOSTIC STUDIES:  Oxygen Saturation is 96% on RA, adequate by my interpretation.    COORDINATION OF CARE:  5:42 PM Discussed treatment plan with pt at bedside and pt agreed to plan.   Labs (all labs ordered are listed, but only abnormal results are displayed) Labs Reviewed - No  data to display  EKG  EKG Interpretation None      Radiology No results found.  Procedures .Marland Kitchen.Incision and Drainage Date/Time: 12/29/2016 6:53 PM Performed by: Charlestine NightLAWYER, Korvin Valentine Authorized by: Charlestine NightLAWYER, Crystie Yanko   Consent:    Consent obtained:  Verbal   Consent given by:  Patient Universal protocol:    Procedure explained and questions answered to patient or proxy's satisfaction: yes   Procedure details:    Packing materials:  Shonna ChockWick placed Post-procedure details:    Patient tolerance of procedure:  Tolerated well, no immediate complications   INCISION AND DRAINAGE Performed by: Carlyle DollyLAWYER,Akirra Lacerda W Consent: Verbal consent obtained. Risks and benefits: risks, benefits and alternatives were discussed Type: abscess  Body area: Left lower lateral breast  Anesthesia: local infiltration  Incision was made with a scalpel.  Local anesthetic: lidocaine 2 % without epinephrine  Anesthetic total: 8 ml  Complexity: complex Blunt dissection to break up loculations  Drainage: purulent  Drainage amount: Moderate   Packing material: 1/4 in iodoform gauze  Patient tolerance: Patient tolerated the procedure well with no immediate complications.    Medications Ordered in ED Medications - No data to display   Initial Impression / Assessment and Plan / ED Course  I have reviewed the triage vital signs and the nursing notes.  Pertinent labs & imaging results that were available during my care of the patient were reviewed by me and considered in my medical decision making (see chart for details).  Clinical Course    Patient be started on antibiotics due to surrounding cellulitis evident as the patient coming in 2 days for recheck and packing removal.  Patient agrees the plan and all questions were answered.  I advised the patient to return here sooner for any worsening in her condition  Final Clinical Impressions(s) / ED Diagnoses   Final diagnoses:  None    New  Prescriptions New Prescriptions   No medications on file  I personally performed the services described in this documentation, which was scribed in my presence. The recorded information has been reviewed and is accurate.   Charlestine NightChristopher Dorotha Hirschi, PA-C 12/29/16 1928    Loren Raceravid Yelverton, MD 12/30/16 [phone]

## 2016-12-31 ENCOUNTER — Encounter (HOSPITAL_BASED_OUTPATIENT_CLINIC_OR_DEPARTMENT_OTHER): Payer: Self-pay | Admitting: Emergency Medicine

## 2016-12-31 ENCOUNTER — Emergency Department (HOSPITAL_BASED_OUTPATIENT_CLINIC_OR_DEPARTMENT_OTHER)
Admission: EM | Admit: 2016-12-31 | Discharge: 2016-12-31 | Disposition: A | Discharge status: Home / Self Care | Payer: 59 | Attending: Emergency Medicine | Admitting: Emergency Medicine

## 2016-12-31 DIAGNOSIS — I10 Essential (primary) hypertension: Secondary | ICD-10-CM | POA: Diagnosis not present

## 2016-12-31 DIAGNOSIS — Z4801 Encounter for change or removal of surgical wound dressing: Secondary | ICD-10-CM | POA: Diagnosis present

## 2016-12-31 DIAGNOSIS — Z791 Long term (current) use of non-steroidal anti-inflammatories (NSAID): Secondary | ICD-10-CM | POA: Diagnosis not present

## 2016-12-31 DIAGNOSIS — Z79899 Other long term (current) drug therapy: Secondary | ICD-10-CM | POA: Diagnosis not present

## 2016-12-31 DIAGNOSIS — Z5189 Encounter for other specified aftercare: Secondary | ICD-10-CM

## 2016-12-31 DIAGNOSIS — Z09 Encounter for follow-up examination after completed treatment for conditions other than malignant neoplasm: Secondary | ICD-10-CM

## 2016-12-31 MED ORDER — CEPHALEXIN 500 MG PO CAPS: 500.0000 mg | ORAL_CAPSULE | Freq: Three times a day (TID) | ORAL | 0 refills | Status: AC

## 2016-12-31 NOTE — ED Triage Notes (Signed)
Pt had abcess on left lower breast lanced open on Saturday.  Told to followup in ED today for recheck and to have packing removed.  Wound appears to be healing well.  Pt states sore to touch but less sore today than on Saturday.  Pink/clear drainage soaking into gauze.  No signs of infection surrounding wound.

## 2016-12-31 NOTE — ED Provider Notes (Signed)
[facility]   CSN: [id number] Arrival date & time: 12/31/16  0834     History   Chief Complaint Chief Complaint  Patient presents with  . Wound Check    HPI Loretta Wu is a 56 y.o. female.  HPI 56 yo F with PMHx as below here for wound check. Pt was seen 12/30 for left breast abscess. She had mild fever then but was o/w HDS and well appearing. It was lanced, packed, and she was sent home on bactrim abx. She just started taking her bactrim last night. She reports her pain is improving and she has not noticed any worsening redness. It has been draining a small amount that is improving daily. Denies any recurrent fevers, nausea, vomiting. No other medical complaints. Pain improved/resolved with OTC analgesics.  Past Medical History:  Diagnosis Date  . Anxiety   . Depression   . Frequency of urination   . Hypertension   . Interstitial cystitis   . Urgency of urination     There are no active problems to display for this patient.   Past Surgical History:  Procedure Laterality Date  . BLADDER SURGERY      OB History    No data available       Home Medications    Prior to Admission medications   Medication Sig Start Date End Date Taking? Authorizing Provider  acetaminophen (TYLENOL) 500 MG tablet Take 500 mg by mouth once. For pain    Historical Provider, MD  amoxicillin-clavulanate (AUGMENTIN) 875-125 MG per tablet Take 1 tablet by mouth 2 (two) times daily.    Historical Provider, MD  Calcium Carbonate-Vitamin D (CALCIUM + D PO) Take 1 tablet by mouth daily.    Historical Provider, MD  cephALEXin (KEFLEX) 500 MG capsule Take 1 capsule (500 mg total) by mouth 3 (three) times daily. 12/31/16 01/07/17  Shaune Pollack, MD  HYDROcodone-acetaminophen (NORCO) 5-325 MG per tablet Take 1-2 tablets by mouth every 6 (six) hours as needed (for pain). 09/23/14   John Molpus, MD  ibuprofen (ADVIL,MOTRIN) 800 MG tablet Take 1 tablet (800 mg total) by mouth  every 8 (eight) hours as needed. 12/29/16   Charlestine Night, PA-C  mirabegron ER (MYRBETRIQ) 50 MG TB24 Take by mouth daily.    Historical Provider, MD  nitrofurantoin (MACRODANTIN) 50 MG capsule Take 50 mg by mouth daily.    Historical Provider, MD  olmesartan (BENICAR) 20 MG tablet Take 20 mg by mouth daily.    Historical Provider, MD  sertraline (ZOLOFT) 50 MG tablet Take 50 mg by mouth daily.    Historical Provider, MD  sulfamethoxazole-trimethoprim (BACTRIM DS,SEPTRA DS) 800-160 MG tablet Take 2 tablets by mouth 2 (two) times daily. 12/29/16 01/08/17  Charlestine Night, PA-C  traMADol (ULTRAM) 50 MG tablet Take 1 tablet (50 mg total) by mouth every 6 (six) hours as needed for severe pain. 12/29/16   Christopher Lawyer, PA-C  valsartan (DIOVAN) 160 MG tablet Take 160 mg by mouth daily.    Historical Provider, MD    Family History No family history on file.  Social History Social History  Substance Use Topics  . Smoking status: Never Smoker  . Smokeless tobacco: Never Used  . Alcohol use No     Allergies   Patient has no known allergies.   Review of Systems Review of Systems  Constitutional: Negative for chills, fatigue and fever.  HENT: Negative for congestion and rhinorrhea.   Eyes: Negative for visual disturbance.  Respiratory: Negative for cough, shortness of breath and wheezing.   Cardiovascular: Negative for chest pain and leg swelling.  Gastrointestinal: Negative for abdominal pain, diarrhea, nausea and vomiting.  Genitourinary: Negative for dysuria and flank pain.  Musculoskeletal: Negative for neck pain and neck stiffness.  Skin: Positive for rash and wound.  Allergic/Immunologic: Negative for immunocompromised state.  Neurological: Negative for syncope, weakness and headaches.  All other systems reviewed and are negative.    Physical Exam Updated Vital Signs BP 130/63 (BP Location: Left Arm)   Pulse 101   Temp 98.2 F (36.8 C) (Oral)   Resp 18   Ht 5'  6" (1.676 m)   Wt 235 lb (106.6 kg)   LMP 05/17/2010   SpO2 100%   BMI 37.93 kg/m   Physical Exam  Constitutional: She is oriented to person, place, and time. She appears well-developed and well-nourished. No distress.  HENT:  Head: Normocephalic and atraumatic.  Eyes: Conjunctivae are normal.  Neck: Neck supple.  Cardiovascular: Normal rate, regular rhythm and normal heart sounds.  Exam reveals no friction rub.   No murmur heard. Pulmonary/Chest: Effort normal and breath sounds normal. No respiratory distress. She has no wheezes. She has no rales.  Abdominal: She exhibits no distension.  Musculoskeletal: She exhibits no edema.  Neurological: She is alert and oriented to person, place, and time. She exhibits normal muscle tone.  Skin: Skin is warm. Capillary refill takes less than 2 seconds.  Approx 1 cm linear incision to left undersurface of breast, with minimal surrounding erythema and drainage. Packing removed - underlying wound tissue intact, with intact, healthy-appearing granulation tissue. No surrounding erythema or crepitance.  Psychiatric: She has a normal mood and affect.  Nursing note and vitals reviewed.    ED Treatments / Results  Labs (all labs ordered are listed, but only abnormal results are displayed) Labs Reviewed - No data to display  EKG  EKG Interpretation None       Radiology No results found.  Procedures Procedures (including critical care time)  Medications Ordered in ED Medications - No data to display   EMERGENCY DEPARTMENT US SOFT TISSUE INTERPRETATION "Study: Limited Ultrasound of the noted body part in comments below"  INDICATIONS: Pain and Soft tissue infection Multiple views of the body part are obtained with a multi-frequency linear probe  PERFORMED BY:  Myself  IMAGES ARCHIVED?: Yes  SIDE:Left  BODY PART:Breast  FINDINGS: No abcess noted and Cellulitis present  LIMITATIONS:  Body Habitus  INTERPRETATION:  No abcess  noted and Cellulitis present    Initial Impression / Assessment and Plan / ED Course  I have reviewed the triage vital signs and the nursing notes.  Pertinent labs & imaging results that were available during my care of the patient were reviewed by me and considered in my medical decision making (see chart for details).  Clinical Course     56 yo well appearing female here for wound check s/p left breast abscess I&D. On arrival, VSS and WNL. Packing removed and wound inspected. Wounds appears very healthy with intact granulation tissue and minimal surrounding erythema. She has had no fever, vomiting, or signs of developing systemic infection. Will add on Keflex given persistent pain, otherwise advise continued bactrim, warm soaks, and routine wound care. Repeat U/S today shows no recurrence of abscess, which is reassuring.  Final Clinical Impressions(s) / ED Diagnoses   Final diagnoses:  Visit for wound check  Encounter for recheck of abscess following incision and drainage  New Prescriptions Discharge Medication List as of 12/31/2016  9:00 AM    START taking these medications   Details  cephALEXin (KEFLEX) 500 MG capsule Take 1 capsule (500 mg total) by mouth 3 (three) times daily., Starting Mon 12/31/2016, Until Mon 01/07/2017, Print         Shaune Pollack, MD 12/31/16 [phone]

## 2016-12-31 NOTE — ED Notes (Signed)
Dr Isaacs in room with pt now. 

## 2017-01-25 ENCOUNTER — Other Ambulatory Visit: Payer: Self-pay | Admitting: Internal Medicine

## 2017-01-25 DIAGNOSIS — Z1231 Encounter for screening mammogram for malignant neoplasm of breast: Secondary | ICD-10-CM

## 2017-02-20 ENCOUNTER — Ambulatory Visit
Admission: RE | Admit: 2017-02-20 | Discharge: 2017-02-20 | Disposition: A | Discharge status: Home / Self Care | Payer: 59 | Source: Ambulatory Visit | Attending: Internal Medicine | Admitting: Internal Medicine

## 2017-02-20 DIAGNOSIS — Z1231 Encounter for screening mammogram for malignant neoplasm of breast: Secondary | ICD-10-CM

## 2017-03-27 ENCOUNTER — Ambulatory Visit: Payer: Self-pay | Admitting: Psychology

## 2017-05-01 ENCOUNTER — Ambulatory Visit (INDEPENDENT_AMBULATORY_CARE_PROVIDER_SITE_OTHER): Payer: PRIVATE HEALTH INSURANCE | Admitting: Psychology

## 2017-05-01 DIAGNOSIS — F411 Generalized anxiety disorder: Secondary | ICD-10-CM | POA: Diagnosis not present

## 2018-01-22 ENCOUNTER — Other Ambulatory Visit: Payer: Self-pay | Admitting: Internal Medicine

## 2018-01-22 DIAGNOSIS — Z1231 Encounter for screening mammogram for malignant neoplasm of breast: Secondary | ICD-10-CM

## 2018-02-03 ENCOUNTER — Other Ambulatory Visit: Payer: Self-pay | Admitting: Internal Medicine

## 2018-02-03 ENCOUNTER — Other Ambulatory Visit: Payer: Self-pay | Admitting: Rehabilitation

## 2018-02-03 DIAGNOSIS — M50322 Other cervical disc degeneration at C5-C6 level: Secondary | ICD-10-CM

## 2018-02-21 ENCOUNTER — Ambulatory Visit: Payer: Self-pay

## 2018-03-07 ENCOUNTER — Ambulatory Visit: Payer: 59 | Admitting: Psychology

## 2018-03-07 ENCOUNTER — Ambulatory Visit
Admission: RE | Admit: 2018-03-07 | Discharge: 2018-03-07 | Disposition: A | Discharge status: Home / Self Care | Payer: 59 | Source: Ambulatory Visit | Attending: Internal Medicine | Admitting: Internal Medicine

## 2018-03-07 DIAGNOSIS — Z1231 Encounter for screening mammogram for malignant neoplasm of breast: Secondary | ICD-10-CM

## 2019-05-27 ENCOUNTER — Other Ambulatory Visit: Payer: Self-pay | Admitting: Internal Medicine

## 2019-05-27 DIAGNOSIS — Z1231 Encounter for screening mammogram for malignant neoplasm of breast: Secondary | ICD-10-CM

## 2019-06-24 ENCOUNTER — Emergency Department (HOSPITAL_BASED_OUTPATIENT_CLINIC_OR_DEPARTMENT_OTHER)
Admission: EM | Admit: 2019-06-24 | Discharge: 2019-06-24 | Disposition: A | Discharge status: Home / Self Care | Payer: 59 | Attending: Emergency Medicine | Admitting: Emergency Medicine

## 2019-06-24 ENCOUNTER — Encounter (HOSPITAL_BASED_OUTPATIENT_CLINIC_OR_DEPARTMENT_OTHER): Payer: Self-pay | Admitting: Emergency Medicine

## 2019-06-24 ENCOUNTER — Other Ambulatory Visit: Payer: Self-pay

## 2019-06-24 DIAGNOSIS — M5416 Radiculopathy, lumbar region: Secondary | ICD-10-CM | POA: Insufficient documentation

## 2019-06-24 DIAGNOSIS — I10 Essential (primary) hypertension: Secondary | ICD-10-CM | POA: Diagnosis not present

## 2019-06-24 DIAGNOSIS — M25551 Pain in right hip: Secondary | ICD-10-CM | POA: Diagnosis present

## 2019-06-24 DIAGNOSIS — Z79899 Other long term (current) drug therapy: Secondary | ICD-10-CM | POA: Diagnosis not present

## 2019-06-24 MED ORDER — MELOXICAM 15 MG PO TABS: ORAL_TABLET | ORAL | 0 refills | Status: DC

## 2019-06-24 MED ORDER — HYDROCODONE-ACETAMINOPHEN 5-325 MG PO TABS
2.0000 | ORAL_TABLET | Freq: Once | ORAL | Status: AC
  Administered 2019-06-24: 02:00:00 2 via ORAL
  Filled 2019-06-24: qty 2

## 2019-06-24 MED ORDER — HYDROCODONE-ACETAMINOPHEN 5-325 MG PO TABS: 1.0000 | ORAL_TABLET | ORAL | 0 refills | Status: DC | PRN

## 2019-06-24 NOTE — ED Provider Notes (Signed)
[facility] Provider Note: Georgena Spurling, MD, FACEP  CSN: [id number] MRN: [id number] ARRIVAL: 06/24/19 at [place]: [place]  Hip Pain   HISTORY OF PRESENT ILLNESS  06/24/19 1:46 AM Loretta Wu is a 58 y.o. female with a 4-day history of pain which is primarily felt in the right posterior hip but begins in the right paralumbar region and radiates down the right leg and about the L5 dermatome.  She rates her pain as a 10 out of 10, worse with movement of her lower back or of the right hip.  She denies associated numbness, weakness, bowel or bladder changes.  She has taken tramadol and an unspecified arthritis medication without relief.  She denies initiating trauma.  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   Past Medical History:  Diagnosis Date  . Anxiety   . Depression   . Frequency of urination   . Hypertension   . Interstitial cystitis   . Urgency of urination     Past Surgical History:  Procedure Laterality Date  . BLADDER SURGERY      Family History  Problem Relation Age of Onset  . Hypertension Father   . Glaucoma Father   . Breast cancer Neg Hx     Social History   Tobacco Use  . Smoking status: Never Smoker  . Smokeless tobacco: Never Used  Substance Use Topics  . Alcohol use: No  . Drug use: No    Prior to Admission medications   Medication Sig Start Date End Date Taking? Authorizing Provider  acetaminophen (TYLENOL) 500 MG tablet Take 500 mg by mouth once. For pain   Yes [provider]  meloxicam (MOBIC) 15 MG tablet Take 15 mg by mouth daily.   Yes [provider]  traMADol (ULTRAM) 50 MG tablet Take 1 tablet (50 mg total) by mouth every 6 (six) hours as needed for severe pain. 12/29/16  Yes Lawyer, Harrell Gave, PA-C  traZODone (DESYREL) 50 MG tablet Take 50 mg by mouth at bedtime. 1-3 tabs as needed at bedtime   Yes [provider]  valsartan-hydrochlorothiazide  (DIOVAN-HCT) 160-12.5 MG tablet Take 1 tablet by mouth daily.   Yes [provider]  amoxicillin-clavulanate (AUGMENTIN) 875-125 MG per tablet Take 1 tablet by mouth 2 (two) times daily.    [provider]  Calcium Carbonate-Vitamin D (CALCIUM + D PO) Take 1 tablet by mouth daily.    [provider]  HYDROcodone-acetaminophen (NORCO) 5-325 MG per tablet Take 1-2 tablets by mouth every 6 (six) hours as needed (for pain). 09/23/14   Sharisse Rantz, MD  ibuprofen (ADVIL,MOTRIN) 800 MG tablet Take 1 tablet (800 mg total) by mouth every 8 (eight) hours as needed. 12/29/16   Lawyer, Harrell Gave, PA-C  mirabegron ER (MYRBETRIQ) 50 MG TB24 Take by mouth daily.    [provider]  nitrofurantoin (MACRODANTIN) 50 MG capsule Take 50 mg by mouth daily.    [provider]  olmesartan (BENICAR) 20 MG tablet Take 20 mg by mouth daily.    [provider]  sertraline (ZOLOFT) 50 MG tablet Take 50 mg by mouth daily.    [provider]  valsartan (DIOVAN) 160 MG tablet Take 160 mg by mouth daily.    [provider]    Allergies Patient has no known allergies.   REVIEW OF SYSTEMS  Negative except as noted here or in the History of Present Illness.   PHYSICAL  EXAMINATION  Initial Vital Signs Blood pressure (!) 160/82, pulse (!) 11, temperature 98.3 F (36.8 C), temperature source Oral, resp. rate 20, height 5' 6.5" (1.689 m), weight 108.9 kg, last menstrual period 05/17/2010, SpO2 96 %.  Examination General: Well-developed, well-nourished female in no acute distress; appearance consistent with age of record HENT: normocephalic; atraumatic Eyes: pupils equal, round and reactive to light; extraocular muscles intact Neck: supple Heart: regular rate and rhythm Lungs: clear to auscultation bilaterally Abdomen: soft; nondistended; nontender; bowel sounds present Extremities: No deformity; full range of motion except right hip; pulses normal  Back: Right paralumbar tenderness with tenderness down the L5 dermatome to about the knee; decreased range of motion of the right hip due to pain; patient unable to lie supine for straight leg raise test Neurologic: Awake, alert and oriented; motor function intact in all extremities and symmetric; sensation intact and symmetric in the lower extremities; no facial droop Skin: Warm and dry Psychiatric: Normal mood and affect   RESULTS  Summary of this visit's results, reviewed by myself:   EKG Interpretation  Date/Time:    Ventricular Rate:    PR Interval:    QRS Duration:   QT Interval:    QTC Calculation:   R Axis:     Text Interpretation:        Laboratory Studies: No results found for this or any previous visit (from the past 24 hour(s)). Imaging Studies: No results found.  ED COURSE and MDM  Nursing notes and initial vitals signs, including pulse oximetry, reviewed.  Vitals:   06/24/19 0141 06/24/19 0142  BP: (!) 160/82   Pulse: (!) 11   Resp: 20   Temp: 98.3 F (36.8 C)   TempSrc: Oral   SpO2: 96%   Weight:  108.9 kg  Height:  5' 6.5" (1.689 m)   The pattern of the patient's pain is consistent with right L5 radiculopathy.  Iliotibial band syndrome seems less likely as this would not be expected to affect the lower back or hip.  We will treat her pain and refer back to her PCP.  She was advised an MRI and neurosurgical referral may be necessary if symptoms persist.  PROCEDURES    ED DIAGNOSES     ICD-10-CM   1. Lumbar radiculopathy  M54.16        Loretta Osment, MD 06/24/19 0200

## 2019-06-24 NOTE — ED Triage Notes (Signed)
Pt is c/o right hip pain  Pt states the pain is radiating down her right leg  Pain started about 4 days ago and has progressively gotten worse  Pt states she took 2 tramadol and something for arthritic pain but it has not helped

## 2019-07-20 ENCOUNTER — Ambulatory Visit: Payer: 59

## 2019-07-24 ENCOUNTER — Ambulatory Visit
Admission: RE | Admit: 2019-07-24 | Discharge: 2019-07-24 | Disposition: A | Discharge status: Home / Self Care | Payer: 59 | Source: Ambulatory Visit | Attending: Internal Medicine | Admitting: Internal Medicine

## 2019-07-24 ENCOUNTER — Other Ambulatory Visit: Payer: Self-pay

## 2019-07-24 DIAGNOSIS — Z1231 Encounter for screening mammogram for malignant neoplasm of breast: Secondary | ICD-10-CM

## 2020-06-22 ENCOUNTER — Other Ambulatory Visit: Payer: Self-pay | Admitting: Internal Medicine

## 2020-06-22 DIAGNOSIS — Z1231 Encounter for screening mammogram for malignant neoplasm of breast: Secondary | ICD-10-CM

## 2020-08-18 ENCOUNTER — Telehealth (HOSPITAL_COMMUNITY): Payer: Self-pay | Admitting: Psychiatry

## 2020-08-18 ENCOUNTER — Other Ambulatory Visit: Payer: Self-pay

## 2020-08-18 ENCOUNTER — Ambulatory Visit
Admission: RE | Admit: 2020-08-18 | Discharge: 2020-08-18 | Disposition: A | Discharge status: Home / Self Care | Payer: 59 | Source: Ambulatory Visit | Attending: Internal Medicine | Admitting: Internal Medicine

## 2020-08-18 DIAGNOSIS — Z1231 Encounter for screening mammogram for malignant neoplasm of breast: Secondary | ICD-10-CM

## 2020-08-22 ENCOUNTER — Other Ambulatory Visit: Payer: Self-pay

## 2020-08-22 ENCOUNTER — Other Ambulatory Visit (HOSPITAL_COMMUNITY): Payer: BC Managed Care – PPO | Attending: Psychiatry | Admitting: Psychiatry

## 2020-08-22 DIAGNOSIS — F4310 Post-traumatic stress disorder, unspecified: Secondary | ICD-10-CM | POA: Insufficient documentation

## 2020-08-22 DIAGNOSIS — Z79899 Other long term (current) drug therapy: Secondary | ICD-10-CM | POA: Insufficient documentation

## 2020-08-22 DIAGNOSIS — F419 Anxiety disorder, unspecified: Secondary | ICD-10-CM | POA: Insufficient documentation

## 2020-08-22 DIAGNOSIS — N3010 Interstitial cystitis (chronic) without hematuria: Secondary | ICD-10-CM | POA: Insufficient documentation

## 2020-08-22 DIAGNOSIS — M1990 Unspecified osteoarthritis, unspecified site: Secondary | ICD-10-CM | POA: Insufficient documentation

## 2020-08-22 DIAGNOSIS — F909 Attention-deficit hyperactivity disorder, unspecified type: Secondary | ICD-10-CM | POA: Insufficient documentation

## 2020-08-22 DIAGNOSIS — N329 Bladder disorder, unspecified: Secondary | ICD-10-CM | POA: Insufficient documentation

## 2020-08-22 DIAGNOSIS — I10 Essential (primary) hypertension: Secondary | ICD-10-CM | POA: Insufficient documentation

## 2020-08-22 DIAGNOSIS — Z791 Long term (current) use of non-steroidal anti-inflammatories (NSAID): Secondary | ICD-10-CM | POA: Insufficient documentation

## 2020-08-22 DIAGNOSIS — F329 Major depressive disorder, single episode, unspecified: Secondary | ICD-10-CM | POA: Insufficient documentation

## 2020-08-22 DIAGNOSIS — K589 Irritable bowel syndrome without diarrhea: Secondary | ICD-10-CM | POA: Insufficient documentation

## 2020-08-22 NOTE — Progress Notes (Signed)
Virtual Visit via Telephone Note  I connected with Loretta Wu on @TODAY @ at  9:00 AM EDT by telephone and verified that I am speaking with the correct person using two identifiers.   I discussed the limitations, risks, security and privacy concerns of performing an evaluation and management service by telephone and the availability of in person appointments. I also discussed with the patient that there may be a patient responsible charge related to this service. The patient expressed understanding and agreed to proceed.  I discussed the assessment and treatment plan with the patient. The patient was provided an opportunity to ask questions and all were answered. The patient agreed with the plan and demonstrated an understanding of the instructions.   The patient was advised to call back or seek an in-person evaluation if the symptoms worsen or if the condition fails to improve as anticipated.  I provided 60 minutes of non-face-to-face time during this encounter.   Comprehensive Clinical Assessment (CCA) Note  08/22/2020 Loretta Wu Loretta Wu  Visit Diagnosis:   No diagnosis found.    CCA Screening, Triage and Referral (STR)  Patient Reported Information How did you hear about [id number]? Other (Comment)  Referral name: Dr. [place]  Referral phone number: No data recorded  Whom do you see for routine medical problems? Primary Care  Practice/Facility Name: Dr. [name]  Practice/Facility Phone Number: No data recorded Name of Contact: No data recorded Contact Number: No data recorded Contact Fax Number: No data recorded Prescriber Name: cc: above  Prescriber Address (if known): No data recorded  What Is the Reason for Your Visit/Call Today? Worsening depressive and anxiety sx's  How Long Has This Been Causing You Problems? 1-6 months  What Do You Feel Would Help You the Most Today? Group Therapy   Have You Recently Been in Any Inpatient Treatment  (Hospital/Detox/Crisis Center/28-Day Program)? No  Name/Location of Program/Hospital:No data recorded How Long Were You There? No data recorded When Were You Discharged? No data recorded  Have You Ever Received Services From [facility] Before? No  Who Do You See at [facility]? No data recorded  Have You Recently Had Any Thoughts About Hurting Yourself? No  Are You Planning to Commit Suicide/Harm Yourself At This time? No   Have you Recently Had Thoughts About Hurting Someone [facility]? No  Explanation: No data recorded  Have You Used Any Alcohol or Drugs in the Past 24 Hours? No  How Long Ago Did You Use Drugs or Alcohol? No data recorded What Did You Use and How Much? No data recorded  Do You Currently Have a Therapist/Psychiatrist? Yes  Name of Therapist/Psychiatrist: Dr. Karolee Ohs and [name], LCSW   Have You Been Recently Discharged From Any Office Practice or Programs? No  Explanation of Discharge From Practice/Program: No data recorded    CCA Screening Triage Referral Assessment Type of Contact: Tele-Assessment  Is this Initial or Reassessment? Initial Assessment  Date Telepsych consult ordered in [facility]:  No data recorded Time Telepsych consult ordered in [facility]:  No data recorded  Patient Reported Information Reviewed? No data recorded Patient Left Without Being Seen? No data recorded Reason for Not Completing Assessment: No data recorded  Collateral Involvement: No data recorded  Does Patient Have a Court Appointed Legal Guardian? No data recorded Name and Contact of Legal Guardian: No data recorded If Minor and Not Living with Parent(s), Who has Custody? No data recorded Is CPS involved or ever been involved? Never  Is  APS involved or ever been involved? Never   Patient Determined To Be At Risk for Harm To Self or Others Based on Review of Patient Reported Information or Presenting Complaint? No  Method: No data recorded Availability of Means: No  data recorded Intent: No data recorded Notification Required: No data recorded Additional Information for Danger to Others Potential: No data recorded Additional Comments for Danger to Others Potential: No data recorded Are There Guns or Other Weapons in Your Home? No data recorded Types of Guns/Weapons: No data recorded Are These Weapons Safely Secured?                            No data recorded Who Could Verify You Are Able To Have These Secured: No data recorded Do You Have any Outstanding Charges, Pending Court Dates, Parole/Probation? No data recorded Contacted To Inform of Risk of Harm To Self or Others: No data recorded  Location of Assessment: [facility]   Does Patient Present under Involuntary Commitment? No  IVC Papers Initial File Date: No data recorded  [place] of Residence: [place]   Patient Currently Receiving the Following Services: IOP (Intensive Outpatient Program)   Determination of Need: No data recorded  Options For Referral: Intensive Outpatient Therapy     CCA Biopsychosocial  Intake/Chief Complaint:  CCA Intake With Chief Complaint CCA Part Two Date: 08/22/20 CCA Part Two Time: 1445 Chief Complaint/Presenting Problem: This is a 59 yr old, divorced, employed, Caucasian female who was referred per Dr. [name]; treatment for depressive and anxiety symptoms.  According to pt, her symptoms worsened June 2021.  Triggers:  1) Job ([employer]) of 25 yrs.  Pt works from home d/t pandemic; been out on short term disability since 06-28-20.  Pt c/o unrealistic quotas and a lot of micro-managing.  2)  Living situation:  resides with ex-husband.  He moved back in to assist pt financially.  3) Medical Issues:  Arthritis, IBS, Small bladder issues, HTN, Interstitial Cystitis.  Pt denies any prior psychiatric hospitalizations or suicide attempts/gestures.  Has been seeing Dr. [name] and Hurley Cisco, LCSW for a couple of yrs.  Family hx:  Mom (depressed with  prior sucide attempt: OD). Patient's Currently Reported Symptoms/Problems: Poor concentration, flucuating sleep, irritability, crying spells, no motivation, anhedonia, poor energy, sadness, anxious, isolative, ruminating thoughts Individual's Strengths: "I am reliable." Individual's Preferences: "Pt would like to work on improving her self confidence." Type of Services Patient Feels Are Needed: MH-IOP  Mental Health Symptoms Depression:  Depression: Change in energy/activity, Difficulty Concentrating, Fatigue, Sleep (too much or little), Tearfulness, Irritability, Duration of symptoms greater than two weeks  Mania:  Mania: None  Anxiety:   Anxiety: Restlessness  Psychosis:  Psychosis: None  Trauma:  Trauma: N/A  Obsessions:  Obsessions: N/A  Compulsions:  Compulsions: N/A  Inattention:  Inattention: N/A  Hyperactivity/Impulsivity:  Hyperactivity/Impulsivity: N/A  Oppositional/Defiant Behaviors:  Oppositional/Defiant Behaviors: N/A  Emotional Irregularity:  Emotional Irregularity: N/A  Other Mood/Personality Symptoms:      Mental Status Exam Appearance and self-care  Stature:  Stature: Average  Weight:  Weight: Overweight  Clothing:  Clothing: Casual  Grooming:  Grooming: Normal  Cosmetic use:  Cosmetic Use: None  Posture/gait:  Posture/Gait: Normal  Motor activity:  Motor Activity: Not Remarkable  Sensorium  Attention:  Attention: Normal  Concentration:  Concentration: Normal  Orientation:  Orientation: X5  Recall/memory:  Recall/Memory: Normal  Affect and Mood  Affect:  Affect: Anxious  Mood:  Mood: Depressed  Relating  Eye contact:  Eye Contact: Normal  Facial expression:  Facial Expression: Responsive  Attitude toward examiner:  Attitude Toward Examiner: Cooperative  Thought and Language  Speech flow:    Thought content:  Thought Content: Appropriate to Mood and Circumstances  Preoccupation:  Preoccupations: None  Hallucinations:     Organization:     Printmaker of Knowledge:  Fund of Knowledge: Average  Intelligence:  Intelligence: Average  Abstraction:  Abstraction: Normal  Judgement:  Judgement: Good  Reality Testing:  Reality Testing: Adequate  Insight:  Insight: Good  Decision Making:  Decision Making: Vacilates  Social Functioning  Social Maturity:  Social Maturity: Isolates  Social Judgement:  Social Judgement: Normal  Stress  Stressors:  Stressors: Housing, Surveyor, quantity, Work  Coping Ability:  Coping Ability: Building surveyor Deficits:  Skill Deficits: Scientist, physiological, Interpersonal, Communication  Supports:  Supports: Friends/Service system     Religion: Religion/Spirituality Are You A Religious Person?: No  Leisure/Recreation: Leisure / Recreation Do You Have Hobbies?: Yes Leisure and Hobbies: reading and Optician, dispensing  Exercise/Diet: Exercise/Diet Do You Exercise?: No Have You Gained or Lost A Significant Amount of Weight in the Past Six Months?: No Do You Follow a Special Diet?: No Do You Have Any Trouble Sleeping?: Yes Explanation of Sleeping Difficulties: "flucuates"   CCA Employment/Education  Employment/Work Situation: Employment / Work Situation Employment situation: Employed Where is patient currently employed?: [employer] How long has patient been employed?: 25 yrs Patient's job has been impacted by current illness: Yes Describe how patient's job has been impacted: currently out on STD Has patient ever been in the [employer]?: No  Education: Education Is Patient Currently Attending School?: No Did Garment/textile technologist From [employer]?: Yes Did Theme park manager?: Yes What Type of College Degree Do you Have?: only attended 1 1/2 yrs Did Designer, television/film set?: No What Was Your Major?: Business Admin Did You Have An Individualized Education Program (IIEP): No Did You Have Any Difficulty At School?: No Patient's Education Has Been Impacted by Current Illness: No   CCA Family/Childhood  History  Family and Relationship History: Family history Marital status: Divorced Divorced, when?: Jan. 21, 2020 Additional relationship information: Divorced twice from ex-husband What is your sexual orientation?: heterosexual Does patient have children?: No  Childhood History:  Childhood History By whom was/is the patient raised?: Father Additional childhood history information: Born in [place], [place].  Reports a traumatic childhood.  Moved from [place] to [place], [place] after mother attempted suicide.  "My mom then vanished.  Mom left her clothes and everything behind.  Till this day I don't know what happened to her.  I was left with my father.  He remarried a yr later.  He said he got married because he couldn't raise me alone."  School:  Sr yr pt went to college.  Pt states no emotion was shown to her while growing up. Description of patient's relationship with caregiver when they were a child: No emotion shown from father or stepmother Does patient have siblings?: No Did patient suffer any verbal/emotional/physical/sexual abuse as a child?: No Has patient ever been sexually abused/assaulted/raped as an adolescent or adult?: No Was the patient ever a victim of a crime or a disaster?: No Witnessed domestic violence?: No Has patient been affected by domestic violence as an adult?: No  Child/Adolescent Assessment:     CCA Substance Use  Alcohol/Drug Use: Alcohol / Drug Use Pain Medications: cc: MAR Prescriptions: cc:  MAR Over the Counter: cc: MAR History of alcohol / drug use?: No history of alcohol / drug abuse                         ASAM's:  Six Dimensions of Multidimensional Assessment  Dimension 1:  Acute Intoxication and/or Withdrawal Potential:      Dimension 2:  Biomedical Conditions and Complications:      Dimension 3:  Emotional, Behavioral, or Cognitive Conditions and Complications:     Dimension 4:  Readiness to Change:     Dimension 5:  Relapse, Continued  use, or Continued Problem Potential:     Dimension 6:  Recovery/Living Environment:     ASAM Severity Score:    ASAM Recommended Level of Treatment:     Substance use Disorder (SUD)    Recommendations for Services/Supports/Treatments: Recommendations for Services/Supports/Treatments Recommendations For Services/Supports/Treatments: IOP (Intensive Outpatient Program)  DSM5 Diagnoses: There are no problems to display for this patient.   Patient Centered Plan: Patient is on the following Treatment Plan(s):  Anxiety, Depression and Post Traumatic Stress Disorder Oriented pt to virtual MH-IOP.  Pt gave verbal consent for treatment, to release chart information to referred providers and to complete any forms if needed.  Pt also gave consent for attending group virtually d/t COVID-19 social distancing restrictions.  Encouraged support groups.  F/U with Dr. Evelene CroonKaur and Hurley CiscoBarbara Fousek, LCSW.  Referrals to Alternative Service(s): Referred to Alternative Service(s):   Place:   Date:   Time:    Referred to Alternative Service(s):   Place:   Date:   Time:    Referred to Alternative Service(s):   Place:   Date:   Time:    Referred to Alternative Service(s):   Place:   Date:   Time:     Jeri ModenaLARK, RITA, M.Ed,CNA

## 2020-08-24 ENCOUNTER — Other Ambulatory Visit: Payer: Self-pay

## 2020-08-24 ENCOUNTER — Encounter (HOSPITAL_COMMUNITY): Payer: Self-pay | Admitting: Psychiatry

## 2020-08-24 ENCOUNTER — Other Ambulatory Visit (HOSPITAL_COMMUNITY): Payer: BC Managed Care – PPO | Admitting: Psychiatry

## 2020-08-24 DIAGNOSIS — F909 Attention-deficit hyperactivity disorder, unspecified type: Secondary | ICD-10-CM | POA: Diagnosis not present

## 2020-08-24 DIAGNOSIS — N3010 Interstitial cystitis (chronic) without hematuria: Secondary | ICD-10-CM | POA: Diagnosis not present

## 2020-08-24 DIAGNOSIS — F4310 Post-traumatic stress disorder, unspecified: Secondary | ICD-10-CM | POA: Diagnosis not present

## 2020-08-24 DIAGNOSIS — Z79899 Other long term (current) drug therapy: Secondary | ICD-10-CM | POA: Diagnosis not present

## 2020-08-24 DIAGNOSIS — N329 Bladder disorder, unspecified: Secondary | ICD-10-CM | POA: Diagnosis not present

## 2020-08-24 DIAGNOSIS — M1990 Unspecified osteoarthritis, unspecified site: Secondary | ICD-10-CM | POA: Diagnosis not present

## 2020-08-24 DIAGNOSIS — Z791 Long term (current) use of non-steroidal anti-inflammatories (NSAID): Secondary | ICD-10-CM | POA: Diagnosis not present

## 2020-08-24 DIAGNOSIS — F329 Major depressive disorder, single episode, unspecified: Secondary | ICD-10-CM | POA: Diagnosis not present

## 2020-08-24 DIAGNOSIS — F419 Anxiety disorder, unspecified: Secondary | ICD-10-CM | POA: Diagnosis not present

## 2020-08-24 DIAGNOSIS — K589 Irritable bowel syndrome without diarrhea: Secondary | ICD-10-CM | POA: Diagnosis not present

## 2020-08-24 DIAGNOSIS — F331 Major depressive disorder, recurrent, moderate: Secondary | ICD-10-CM

## 2020-08-24 DIAGNOSIS — I10 Essential (primary) hypertension: Secondary | ICD-10-CM | POA: Diagnosis not present

## 2020-08-24 NOTE — Progress Notes (Signed)
Virtual Visit via Telephone Note  I connected with Loretta Wu on 08/24/20 at  9:00 AM EDT by telephone and verified that I am speaking with the correct person using two identifiers.  I discussed the limitations, risks, security and privacy concerns of performing an evaluation and management service by telephone and the availability of in person appointments. I also discussed with the patient that there may be a patient responsible charge related to this service. The patient expressed understanding and agreed to proceed.  I discussed the assessment and treatment plan with the patient. The patient was provided an opportunity to ask questions and all were answered. The patient agreed with the plan and demonstrated an understanding of the instructions.   The patient was advised to call back or seek an in-person evaluation if the symptoms worsen or if the condition fails to improve as anticipated.  I provided  minutes of non-face-to-face time during this encounter.   Maryagnes Amos, FNP   Psychiatric Initial Adult Assessment   Patient Identification: Loretta Wu MRN:  [id number] Date of Evaluation:  08/24/2020 Referral Source: Loretta Wu Chief Complaint:  Anxiety and worsening depresison Visit Diagnosis: No diagnosis found.  History of Present Illness:  This is a 59 yr old, divorced, employed, Caucasian female who was referred per Dr. Milagros Wu; treatment for depressive and anxiety symptoms.  According to pt, her symptoms worsened June 2021.  Triggers:  1) Job ([employer]) of 25 yrs.  Pt works from home d/t pandemic; been out on short term disability since 06-28-20.  Pt c/o unrealistic quotas and a lot of micro-managing.  2)  Living situation:  resides with ex-husband.  He moved back in to assist pt financially.  3) Medical Issues:  Arthritis, IBS, Small bladder issues, HTN, Interstitial Cystitis.  Pt denies any prior psychiatric hospitalizations or suicide attempts/gestures.   Has been seeing Dr. Evelene Wu and Loretta Cisco, LCSW for a couple of yrs.   Patient's Currently Reported Symptoms/Problems: Poor concentration, flucuating sleep, irritability, crying spells, no motivation, anhedonia, poor energy, sadness, anxious, isolative, ruminating thoughts  Associated Signs/Symptoms: Depression Symptoms:  depressed mood, insomnia, psychomotor retardation, fatigue, feelings of worthlessness/guilt, hopelessness, anxiety, panic attacks, loss of energy/fatigue, disturbed sleep, weight gain, increased appetite, (Hypo) Manic Symptoms:  Financial Extravagance, Impulsivity, Irritable Mood, Anxiety Symptoms:  Excessive Worry, Psychotic Symptoms:  Denies PTSD Symptoms: Had a traumatic exposure:  Mom disappeared when she was younger.   Past Psychiatric History: Depression, Anxiety, PTSD, ADHD. Currently taking medications that include Desvenlaxafine 100mg , Adderall XR 20mg  BID, Adderall IR 20mg  po BID (ADHD), lorazepam 1mg  po BID prn for anxiety, Spravato 84mg  twice per week (started on 08/10, missed a week). Clonidine 0.1mg  po qhs. She is under the care of Dr. , And is current therapist. Denies any previous suicide attempts, inpatient admissions.   Previous Psychotropic Medications: Yes   Substance Abuse History in the last 12 months:  No.  Consequences of Substance Abuse: Negative  Past Medical History:  Past Medical History:  Diagnosis Date  . Anxiety   . Depression   . Frequency of urination   . Hypertension   . Interstitial cystitis   . Urgency of urination     Past Surgical History:  Procedure Laterality Date  . BLADDER SURGERY      Family Psychiatric History: Mom with history of suicide attempt one week prior to disappearing.   Family History:  Family History  Problem Relation Age of Onset  . Hypertension Father   .  Glaucoma Father   . Breast cancer Neg Hx     Social History:   Social History   Socioeconomic History  .  Marital status: Married    Spouse name: Not on file  . Number of children: Not on file  . Years of education: Not on file  . Highest education level: Not on file  Occupational History  . Not on file  Tobacco Use  . Smoking status: Never Smoker  . Smokeless tobacco: Never Used  Vaping Use  . Vaping Use: Never used  Substance and Sexual Activity  . Alcohol use: No  . Drug use: No  . Sexual activity: Not on file  Other Topics Concern  . Not on file  Social History Narrative  . Not on file   Social Determinants of Health   Financial Resource Strain:   . Difficulty of Paying Living Expenses: Not on file  Food Insecurity:   . Worried About Programme researcher, broadcasting/film/video in the Last Year: Not on file  . Ran Out of Food in the Last Year: Not on file  Transportation Needs:   . Lack of Transportation (Medical): Not on file  . Lack of Transportation (Non-Medical): Not on file  Physical Activity:   . Days of Exercise per Week: Not on file  . Minutes of Exercise per Session: Not on file  Stress:   . Feeling of Stress : Not on file  Social Connections:   . Frequency of Communication with Friends and Family: Not on file  . Frequency of Social Gatherings with Friends and Family: Not on file  . Attends Religious Services: Not on file  . Active Member of Clubs or Organizations: Not on file  . Attends Banker Meetings: Not on file  . Marital Status: Not on file    Additional Social History: Denies  Allergies:  No Known Allergies  Metabolic Disorder Labs: No results found for: HGBA1C, MPG No results found for: PROLACTIN No results found for: CHOL, TRIG, HDL, CHOLHDL, VLDL, LDLCALC No results found for: TSH  Therapeutic Level Labs: No results found for: LITHIUM No results found for: CBMZ No results found for: VALPROATE  Current Medications: Current Outpatient Medications  Medication Sig Dispense Refill  . acetaminophen (TYLENOL) 500 MG tablet Take 500 mg by mouth once. For  pain    . amphetamine-dextroamphetamine (ADDERALL XR) 10 MG 24 hr capsule Take 30 mg by mouth daily.    . Calcium Carbonate-Vitamin D (CALCIUM + D PO) Take 1 tablet by mouth daily.    Marland Kitchen HYDROcodone-acetaminophen (NORCO) 5-325 MG tablet Take 1 tablet by mouth every 4 (four) hours as needed for severe pain. 30 tablet 0  . meloxicam (MOBIC) 15 MG tablet Take 15 mg by mouth daily.    . meloxicam (MOBIC) 15 MG tablet Take 1 tablet daily as needed for pain. 15 tablet 0  . traZODone (DESYREL) 50 MG tablet Take 50 mg by mouth at bedtime. 1-3 tabs as needed at bedtime    . valsartan-hydrochlorothiazide (DIOVAN-HCT) 160-12.5 MG tablet Take 1 tablet by mouth daily.     No current facility-administered medications for this visit.    Musculoskeletal: Strength & Muscle Tone: within normal limits Gait & Station: normal Patient leans: N/A  Psychiatric Specialty Exam: Review of Systems  Last menstrual period 05/17/2010.There is no height or weight on file to calculate BMI.  General Appearance: Fairly Groomed  Eye Contact:  Fair  Speech:  Clear and Coherent and Normal Rate  Volume:  Normal  Mood:  Depressed and Dysphoric  Affect:  Appropriate and Congruent  Thought Process:  Coherent and Linear  Orientation:  Full (Time, Place, and Person)  Thought Content:  Logical  Suicidal Thoughts:  No  Homicidal Thoughts:  No  Memory:  Immediate;   Fair Recent;   Fair  Judgement:  Fair  Insight:  Fair  Psychomotor Activity:  Normal  Concentration:  Concentration: Fair and Attention Span: Fair  Recall:  [employer] of Knowledge:Fair  Language: Fair  Akathisia:  No  Handed:  Right  AIMS (if indicated):  not done  Assets:  Communication Skills Desire for Improvement Financial Resources/Insurance Leisure Time Physical Health Social Support  ADL's:  Intact  Cognition: WNL  Sleep:  Fair   Screenings:   Assessment and Plan:  patient enrolled in Intensive Outpatient Program, patient's current  medications are to be continued, a comprehensive treatment plan will be developed and side effects of medications have been reviewed with patient.    Maryagnes Amos, FNP 8/25/202111:05 AM

## 2020-08-24 NOTE — Progress Notes (Signed)
Virtual Visit via Video Note  I connected with Loretta Wu on 08/24/20 at  9:00 AM EDT by a video enabled telemedicine application and verified that I am speaking with the correct person using two identifiers.  At orientation to the IOP program Case Manager discussed the limitations of evaluation and management by telemedicine and the availability of in person appointments. The patient expressed understanding and agreed to proceed with virtual visits throughout the duration of the program.   Location:  Patient: Patient Home Provider: Home Office   History of Present Illness: MDD  Observations/Objective: Check In: Case Manager checked in with all participants to review discharge dates, insurance authorizations, work-related documents and needs of the treatment team, including medication review and assessment.    Initial Therapeutic Activity: Counselor introduced our guest speaker, Peggye Fothergill, [facility] Pharmacist, who shared about psychiatric medications, side effects, treatment considerations and how to communicate with medical professionals. Group Members asked questions and shared medication concerns. Counselor prompted group members to reference a worksheet called, "Body Scan" to jot down questions and concerns about their physical health in preparation for their upcoming appointments with medical professionals. Client shared about new treatment for depression she started last week, which is given by nasal spray and needs monitoring after use. Client discussed side effects and other health conditions. Counselor encouraged routine medical check-ups, preparing for appointments, following up with recommendations and seeking specialist if needed.   Second Therapeutic Activity: Counselor prompted Client to reflect on time since last session to assess mood and functioning. In reviewing skills used since last session, self-care practices, new stressors and symptom identification, Client shared that  she attended a doctors appointment yesterday and was glad to start group today. Client presents with severe depression and moderate anxiety. Client denied any current SI/HI/psychosis.  Check Out: Counselor prompted 2 new group members to share about what brought them to treatment, current life stressors, what they are hoping to gain from treatment and about their support system. Group members welcomed and began the joining process. Client that she wants to improve her interactions and social skills with others. 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 Client identified a limited support system.   Assessment and Plan: Clinician recommends that Client remain in IOP treatment to better manage mental health symptoms, stabilization and to address treatment plan goals. Clinician recommends adherence to crisis/safety plan, taking medications as prescribed, and following up with medical professionals if any issues arise.   Follow Up Instructions: Clinician will send Webex link for next session. The Client was advised to call back or seek an in-person evaluation if the symptoms worsen or if the condition fails to improve as anticipated.     I provided 180 minutes of non-face-to-face time during this encounter.     Hilbert Odor, LCSW

## 2020-08-25 ENCOUNTER — Encounter (HOSPITAL_COMMUNITY): Payer: Self-pay

## 2020-08-25 ENCOUNTER — Other Ambulatory Visit (HOSPITAL_COMMUNITY): Payer: BC Managed Care – PPO | Admitting: Psychiatry

## 2020-08-25 ENCOUNTER — Other Ambulatory Visit: Payer: Self-pay

## 2020-08-25 DIAGNOSIS — F419 Anxiety disorder, unspecified: Secondary | ICD-10-CM | POA: Diagnosis not present

## 2020-08-25 DIAGNOSIS — F331 Major depressive disorder, recurrent, moderate: Secondary | ICD-10-CM

## 2020-08-25 NOTE — Progress Notes (Signed)
Virtual Visit via Video Note  I connected with Loretta Wu on 08/25/20 at  9:00 AM EDT by a video enabled telemedicine application and verified that I am speaking with the correct person using two identifiers.  At orientation to the IOP program Case Manager discussed the limitations of evaluation and management by telemedicine and the availability of in person appointments. The patient expressed understanding and agreed to proceed with virtual visits throughout the duration of the program.   Location:  Patient: Patient Home Provider: Home Office   History of Present Illness: MDD  Observations/Objective: Check In: Case Manager checked in with all participants to review discharge dates, insurance authorizations, work-related documents and needs of the treatment team, including medication review and assessment.    Initial Therapeutic Activity: Counselor introduced [employer], [place] Chaplain to present information and discussion on Grief and Loss. Group members engaged in discussion, sharing how grief impacts them, what comforts them, what emotions are felt, labeling losses, etc. After guest speaker logged off, Counselor prompted group to spend 10-15 minutes journaling to process personal grief and loss situations. Counselor processed entries with group and client's identified areas for additional processing in individual therapy. Client noted feelings of invalidation when support group doesn't allow her to grieve in the way she needs.   Second Therapeutic Activity: Counselor shared information on [facility] [place], since our presenter is unable to attend today's group. Counselor walked group members through their main services, Peer Support, Support Groups, [facility] and [employer]. Counselor prompted group members to share which ones looked interesting and beneficial for their recovery. Client identified Grief and Loss Support group and [facility] as  programs of interest.  Third Therapeutic Activity: Counselor shared psychoeducation of a variety of coping skills, broken down into different coping types. Counselor review skills and prompted group members to share ones they would be willing to try and ones they already find helpful. Client engaged in discussion and stated diversions, walking, watching silly videos, playing games on phone,.  Check Out: Counselor allowed time for group members to check in with each other about current stressors and things that have been helpful thus far in treatment to gain group feedback and encouragement. Client noted that she was feeling sore and tired from going to the chiropractor, so she plans to relax and take it easy today.   Assessment and Plan: Clinician recommends that Client remain in IOP treatment to better manage mental health symptoms, stabilization and to address treatment plan goals. Clinician recommends adherence to crisis/safety plan, taking medications as prescribed, and following up with medical professionals if any issues arise.   Follow Up Instructions: Clinician will send Webex link for next session. The Client was advised to call back or seek an in-person evaluation if the symptoms worsen or if the condition fails to improve as anticipated.     I provided 180 minutes of non-face-to-face time during this encounter.     Hilbert Odor, LCSW

## 2020-08-26 ENCOUNTER — Encounter (HOSPITAL_COMMUNITY): Payer: Self-pay | Admitting: Psychiatry

## 2020-08-26 ENCOUNTER — Other Ambulatory Visit (HOSPITAL_COMMUNITY): Payer: BC Managed Care – PPO | Admitting: Psychiatry

## 2020-08-26 ENCOUNTER — Other Ambulatory Visit: Payer: Self-pay

## 2020-08-26 DIAGNOSIS — F331 Major depressive disorder, recurrent, moderate: Secondary | ICD-10-CM

## 2020-08-26 DIAGNOSIS — F419 Anxiety disorder, unspecified: Secondary | ICD-10-CM | POA: Diagnosis not present

## 2020-08-26 NOTE — Progress Notes (Signed)
Virtual Visit via Video Note  I connected with Loretta Wu on 08/26/20 at  9:00 AM EDT by a video enabled telemedicine application and verified that I am speaking with the correct person using two identifiers.  At orientation to the IOP program Case Manager discussed the limitations of evaluation and management by telemedicine and the availability of in person appointments. The patient expressed understanding and agreed to proceed with virtual visits throughout the duration of the program.   Location:  Patient: Patient Home Provider: Home Office   History of Present Illness: MDD  Observations/Objective: Check In: Case Manager checked in with all participants to review discharge dates, insurance authorizations, work-related documents and needs of the treatment team, including medication review and assessment.    Initial Therapeutic Activity: Counselor facilitated therapeutic processing with group members to assess mood and current functioning, prompting group members to share about application of skills, progress and challenges in treatment/personal lives. Client reports that she has been experiencing increased anxiety and worrisome thoughts, states that she is "stuck in her head". Client decided to talk with a friend to externally process worries, stating it was very helpful. Client reports she is going to work on worrying about the things she can control, not the things out of her control. Client presents with moderate depression and severe anxiety. Client denied any current SI/HI/psychosis.   Second Therapeutic Activity: Counselor provided clients with a worksheet intitled, Understanding Your Own Mental Health. Counselor gave instructions and purpose for the activity, then prompted clients to take time to complete the worksheet based on their specific self-assessments. Counselor prompted clients to share their reflections, with Client discussing their mood, stress levels, energy, sleep,  and motivation.   Check Out: Counselor prompted group members to each share on area of focus on their mental health that they can give intentional attention to over the weekend. Client chose to focus on getting better sleep and applying sleep hygiene practices. Client noted that they are feeling stable and safe going into the weekend and plan to return on Monday to treatment program.   Assessment and Plan: Clinician recommends that Client remain in IOP treatment to better manage mental health symptoms, stabilization and to address treatment plan goals. Clinician recommends adherence to crisis/safety plan, taking medications as prescribed, and following up with medical professionals if any issues arise.   Follow Up Instructions: Clinician will send Webex link for next session. The Client was advised to call back or seek an in-person evaluation if the symptoms worsen or if the condition fails to improve as anticipated.     I provided 180 minutes of non-face-to-face time during this encounter.     Hilbert Odor, LCSW

## 2020-08-29 ENCOUNTER — Encounter (HOSPITAL_COMMUNITY): Payer: Self-pay | Admitting: Psychiatry

## 2020-08-29 ENCOUNTER — Other Ambulatory Visit: Payer: Self-pay

## 2020-08-29 ENCOUNTER — Other Ambulatory Visit (HOSPITAL_COMMUNITY): Payer: BC Managed Care – PPO | Admitting: Psychiatry

## 2020-08-29 DIAGNOSIS — F419 Anxiety disorder, unspecified: Secondary | ICD-10-CM | POA: Diagnosis not present

## 2020-08-29 DIAGNOSIS — F331 Major depressive disorder, recurrent, moderate: Secondary | ICD-10-CM

## 2020-08-29 NOTE — Progress Notes (Signed)
Virtual Visit via Video Note  I connected with Loretta Wu on 08/29/20 at  9:00 AM EDT by a video enabled telemedicine application and verified that I am speaking with the correct person using two identifiers.  At orientation to the IOP program Case Manager discussed the limitations of evaluation and management by telemedicine and the availability of in person appointments. The patient expressed understanding and agreed to proceed with virtual visits throughout the duration of the program.   Location:  Patient: Patient Home Provider: Home Office   History of Present Illness: MDD  Observations/Objective: Check In: Counselor facilitated therapeutic processing with group members to assess mood and current functioning, prompting group members to share about application of skills, progress and challenges in treatment/personal lives. Client reports that she is continuing attending her med appointments for [place], and recovered from tiredness over the weekend. Client noted that she is experiencing heightened anxiety thinking about short term disability paperwork in order and approved, with group offering her reassurance that the program will take care of things, Client shared about getting out of house to visit a friend, but found it difficult to communicate about being in the program and her mental health. Group discussed setting boundaries and role played various ways to communicate what she is comfortable sharing. Client presents with moderate depression and severe anxiety. Client denied any current SI/HI/psychosis.   Initial Therapeutic Activity: Counselor provided psychoeducation on thought stopping techniques, as several group members have noted that they struggle with falling asleep at night due to ruminations and intrusive thoughts. Counselor shared 7 techniques to apply when feeling stuck in thought processes or overwhelmed with anxiety.    Second Therapeutic Activity: Counselor provided  psychoeducation on rules and guidelines for moving back in with your parents as an adult. Several group members have disclosed that they have moved back in with their parents to better cope with mental health crisis or episode, or that they are in the process of doing so. Counselor discussed the shifts in roles, renegotiating expectations and setting boundaries. Client engaged in discussion.  Check Out: Client indicated that they are not currently a risk to self of others. Counselor closed by sharing instructions for therapeutic activity of the Inside Out Mask to present tomorrow during group and the emotional first aid kit to start working on to share in group on Friday. Counselor prompted group members to share one self-care practice or one productivity activity they can engage in today to reduce anxiety. Client notes that they plan to take care of car taxes and spend time relaxing watching tv to decompress from group..   Assessment and Plan: Clinician recommends that Client remain in IOP treatment to better manage mental health symptoms, stabilization and to address treatment plan goals. Clinician recommends adherence to crisis/safety plan, taking medications as prescribed, and following up with medical professionals if any issues arise.   Follow Up Instructions: Clinician will send Webex link for next session. The Client was advised to call back or seek an in-person evaluation if the symptoms worsen or if the condition fails to improve as anticipated.     I provided 180 minutes of non-face-to-face time during this encounter.     Lise Auer, LCSW

## 2020-08-30 ENCOUNTER — Other Ambulatory Visit: Payer: Self-pay

## 2020-08-30 ENCOUNTER — Other Ambulatory Visit (HOSPITAL_COMMUNITY): Payer: BC Managed Care – PPO | Admitting: Licensed Clinical Social Worker

## 2020-08-30 DIAGNOSIS — F419 Anxiety disorder, unspecified: Secondary | ICD-10-CM | POA: Diagnosis not present

## 2020-08-30 DIAGNOSIS — F331 Major depressive disorder, recurrent, moderate: Secondary | ICD-10-CM

## 2020-08-31 ENCOUNTER — Other Ambulatory Visit (HOSPITAL_COMMUNITY): Payer: BC Managed Care – PPO | Attending: Psychiatry | Admitting: Psychiatry

## 2020-08-31 ENCOUNTER — Encounter (HOSPITAL_COMMUNITY): Payer: Self-pay | Admitting: Psychiatry

## 2020-08-31 ENCOUNTER — Other Ambulatory Visit: Payer: Self-pay

## 2020-08-31 DIAGNOSIS — F4310 Post-traumatic stress disorder, unspecified: Secondary | ICD-10-CM | POA: Diagnosis not present

## 2020-08-31 DIAGNOSIS — F419 Anxiety disorder, unspecified: Secondary | ICD-10-CM | POA: Diagnosis present

## 2020-08-31 DIAGNOSIS — F411 Generalized anxiety disorder: Secondary | ICD-10-CM | POA: Insufficient documentation

## 2020-08-31 DIAGNOSIS — F331 Major depressive disorder, recurrent, moderate: Secondary | ICD-10-CM

## 2020-08-31 NOTE — Progress Notes (Signed)
Virtual Visit via Video Note  I connected with Loretta Wu on 08/31/20 at  9:00 AM EDT by a video enabled telemedicine application and verified that I am speaking with the correct person using two identifiers.  At orientation to the IOP program Case Manager discussed the limitations of evaluation and management by telemedicine and the availability of in person appointments. The patient expressed understanding and agreed to proceed with virtual visits throughout the duration of the program.   Location:  Patient: Patient Home Provider: Home Office   History of Present Illness: MDD  Observations/Objective: Check In: Case Manager checked in with all participants to review discharge dates, insurance authorizations, work-related documents and needs of the treatment team, including medication review and assessment.   Initial Therapeutic Activity: Counselor introduced Rolin Barry, [place] Chaplain to present information and discussion on Grief and Loss. Group members engaged in discussion, sharing how grief impacts them, what comforts them, what emotions are felt, labeling losses, etc. After guest speaker logged off, Counselor prompted group to spend 10-15 minutes journaling to process personal grief and loss situations. Counselor processed entries with group and client's identified areas for additional processing in individual therapy.   Second Therapeutic Activity: Counselor facilitated therapeutic processing with group members to assess mood and current functioning, prompting group members to share about application of skills, progress and challenges in treatment/personal lives. Client reports that she had a [place] treatment yesterday, resting afterwards as it causes her exhaustion. Client discussed issues of chronic pain and medical conditions that cause sleep to be difficult. Counselor and group discussed sleep hygiene. Client presents with moderate depression and moderate anxiety. Client  denied any current SI/HI/psychosis.  Check Out: Client indicated that they are not currently a risk to self of others. Counselor closed by sharing instructions for therapeutic activity of the Inside Out Mask to present tomorrow during group and the emotional first aid kit to start working on to share in group on Friday. Counselor prompted group members to share one self-care practice or one productivity activity they can engage in today to reduce anxiety. Client notes that they plan to spend time watching TV to relax.   Assessment and Plan: Clinician recommends that Client remain in IOP treatment to better manage mental health symptoms, stabilization and to address treatment plan goals. Clinician recommends adherence to crisis/safety plan, taking medications as prescribed, and following up with medical professionals if any issues arise.   Follow Up Instructions: Clinician will send Webex link for next session. The Client was advised to call back or seek an in-person evaluation if the symptoms worsen or if the condition fails to improve as anticipated.     I provided 180 minutes of non-face-to-face time during this encounter.     Lise Auer, LCSW

## 2020-08-31 NOTE — Progress Notes (Signed)
Virtual Visit via Video Note  I connected with Loretta Wu on 08/30/20 at  9:00 AM EDT by a video enabled telemedicine application and verified that I am speaking with the correct person using two identifiers.   I discussed the limitations of evaluation and management by telemedicine and the availability of in person appointments. The patient expressed understanding and agreed to proceed.  I discussed the assessment and treatment plan with the patient. The patient was provided an opportunity to ask questions and all were answered. The patient agreed with the plan and demonstrated an understanding of the instructions.   The patient was advised to call back or seek an in-person evaluation if the symptoms worsen or if the condition fails to improve as anticipated.  I provided 120 minutes of non-face-to-face time during this encounter.   Donia Guiles, LCSW     Daily Group Progress Note  Program: IOP  Group Time: 9:00 - 10:30  Participation Level: Active  Behavioral Response: Appropriate and Sharing  Type of Therapy:  Group Therapy  Summary: Clinician led check-in regarding current stressors and situation, and review of patient completed daily inventory. Clinician utilized active listening and empathetic response and validated patient emotions. Clinician facilitated processing group on pertinent issues.   Response: Patient arrived within time allowed and reports that she is feeling "frustrated." [facility] 1-10 with 10 being great. Pt reportsdealing with a frustrating call yesterday that was difficult for her to move past. Pt states the rumination affected her sleep and mood. Pt able to process. Pt  engaged in discussion.       Group Time: 10:30 - 12:00  Participation Level:  Active  Behavioral Response: Appropriate and Sharing  Type of Therapy: Group Therapy  Summary: Cln led group on relationship stress. Group shared ways in  which they are struggling with various relationships in their lives. Cln utilized healthy boundaries, assertiveness, and healthy relationship dynamics to inform and shape discussion.  Response: Pt engaged in discussion and shares difficulty feeling justified to set boundaries and to tell people no. Pt is able to process and brainstorm ways to address boundary issues in the future.     Donia Guiles, LCSW

## 2020-09-01 ENCOUNTER — Encounter (HOSPITAL_COMMUNITY): Payer: Self-pay

## 2020-09-01 ENCOUNTER — Other Ambulatory Visit (HOSPITAL_COMMUNITY): Payer: BC Managed Care – PPO | Admitting: Psychiatry

## 2020-09-01 ENCOUNTER — Other Ambulatory Visit: Payer: Self-pay

## 2020-09-01 DIAGNOSIS — F4310 Post-traumatic stress disorder, unspecified: Secondary | ICD-10-CM | POA: Diagnosis not present

## 2020-09-01 DIAGNOSIS — F331 Major depressive disorder, recurrent, moderate: Secondary | ICD-10-CM

## 2020-09-01 NOTE — Progress Notes (Signed)
Virtual Visit via Video Note  At orientation to the IOP program Case Manager discussed the limitations of evaluation and management by telemedicine and the availability of in person appointments. The patient expressed understanding and agreed to proceed with virtual visits throughout the duration of the program.   Location:  Patient: Patient Home Provider: Home Office   History of Present Illness: MDD  Observations/Objective: Check In: Case Manager checked in with all participants to review discharge dates, insurance authorizations, work-related documents and needs of the treatment team, including medication review and assessment.   Initial Therapeutic Activity: Counselor facilitated therapeutic processing with group members to assess mood and current functioning, prompting group members to share about application of skills, progress and challenges in treatment/personal lives. Client reports that she had a disappointing eye dr appointment. Client discussed impact of physical health needs and the discouragement she feels with overall health. Client shared about difficulties with social interactions, as she described herself as introverted and shy. Counselor and group processed says to challenge self to engage and interact more with others in public in an appropriate manner. Client presents with mocerate depression and high anxiety. Client denied any current SI/HI/psychosis.   Second Therapeutic Activity: Counselor engaged the group in an activity called, The Inside-Out Mask. Counselor prompted the group to use their creativity to express how they present themselves on the outside for others to see, verses what they are really feeling and experiencing on the inside. Counselor prompted clients to share their visual representation. Client shared an image of a thumbs up emoji with a smile-representing that everything is great or good and for her internal image, she shared a picture of a person twisted up  like a pretzel-not sure about her identity, if she is coming or going, health problems, anxiety and depression, sense of abandonment, no affection or love.  Check Out: Client indicated that they are not currently a risk to self of others. Counselor prompted group members to share one self-care practice or one productivity activity they can engage in today to reduce anxiety. Client notes that they plan to relax and spend time virtually with a friend.   Assessment and Plan: Clinician recommends that Client remain in IOP treatment to better manage mental health symptoms, stabilization and to address treatment plan goals. Clinician recommends adherence to crisis/safety plan, taking medications as prescribed, and following up with medical professionals if any issues arise.   Follow Up Instructions: Clinician will send Webex link for next session. The Client was advised to call back or seek an in-person evaluation if the symptoms worsen or if the condition fails to improve as anticipated.     I provided 180 minutes of non-face-to-face time during this encounter.     Hilbert Odor, LCSW

## 2020-09-02 ENCOUNTER — Other Ambulatory Visit (HOSPITAL_COMMUNITY): Payer: BC Managed Care – PPO | Admitting: Psychiatry

## 2020-09-02 ENCOUNTER — Other Ambulatory Visit: Payer: Self-pay

## 2020-09-02 DIAGNOSIS — F4310 Post-traumatic stress disorder, unspecified: Secondary | ICD-10-CM | POA: Diagnosis not present

## 2020-09-02 DIAGNOSIS — F331 Major depressive disorder, recurrent, moderate: Secondary | ICD-10-CM

## 2020-09-06 ENCOUNTER — Other Ambulatory Visit (HOSPITAL_COMMUNITY): Payer: BC Managed Care – PPO | Admitting: Psychiatry

## 2020-09-06 ENCOUNTER — Encounter (HOSPITAL_COMMUNITY): Payer: Self-pay | Admitting: Psychiatry

## 2020-09-06 ENCOUNTER — Other Ambulatory Visit: Payer: Self-pay

## 2020-09-06 DIAGNOSIS — F331 Major depressive disorder, recurrent, moderate: Secondary | ICD-10-CM

## 2020-09-06 DIAGNOSIS — F4310 Post-traumatic stress disorder, unspecified: Secondary | ICD-10-CM | POA: Diagnosis not present

## 2020-09-06 NOTE — Progress Notes (Signed)
Virtual Visit via Video Note  I connected with Jalacia Mattila on 09/06/20 at  9:00 AM EDT by a video enabled telemedicine application and verified that I am speaking with the correct person using two identifiers.  At orientation to the IOP program Case Manager discussed the limitations of evaluation and management by telemedicine and the availability of in person appointments. The patient expressed understanding and agreed to proceed with virtual visits throughout the duration of the program.   Location:  Patient: Patient Home Provider: Home Office   History of Present Illness: MDD  Observations/Objective: Check In: Case Manager checked in with all participants to review discharge dates, insurance authorizations, work-related documents and needs of the treatment team, including medication review and assessment.   Initial Therapeutic Activity: Counselor facilitated therapeutic processing with group members to assess mood and current functioning, prompting group members to share about application of skills, progress and challenges in treatment/personal lives. Client reports that she spent time watching a movie with a friend virtually and enjoyed getting to escape from anxieties while engaging in activity. Client presents with moderate depression and severe anxiety. Client denied any current SI/HI/psychosis.   Second Therapeutic Activity: Counselor and Group Members engaged in an activity of creating individualized Emotional First Aid Kits. Counselor prompted each group member to find items within their home that can help them to cope when not feeling emotionally well. Counselor provided examples for sight, sound, smell, touch and taste. Group members discussed and shared the items they found to create their emotional first aid kit.  Check Out: Client indicated that they are not currently a risk to self of others. Counselor prompted group members to share one self-care practice or one  productivity activity they can engage in today to reduce anxiety. Client notes that they plan to get an MRI completed over the weekend to determine medical needs. Client states feeling very anxious about this test. Client created a plan to take friends to support her by driving and emotional support.   Assessment and Plan: Clinician recommends that Client remain in IOP treatment to better manage mental health symptoms, stabilization and to address treatment plan goals. Clinician recommends adherence to crisis/safety plan, taking medications as prescribed, and following up with medical professionals if any issues arise.   Follow Up Instructions: Clinician will send Webex link for next session. The Client was advised to call back or seek an in-person evaluation if the symptoms worsen or if the condition fails to improve as anticipated.     I provided 180 minutes of non-face-to-face time during this encounter.     Lise Auer, LCSW

## 2020-09-06 NOTE — Progress Notes (Signed)
Virtual Visit via Video Note  I connected with Loretta Wu on 09/06/20 at  9:00 AM EDT by a video enabled telemedicine application and verified that I am speaking with the correct person using two identifiers. At orientation to the IOP program Case Manager discussed the limitations of evaluation and management by telemedicine and the availability of in person appointments. The patient expressed understanding and agreed to proceed with virtual visits throughout the duration of the program.   Location:  Patient: Patient Home Provider: Home Office   History of Present Illness: MDD  Observations/Objective: Check In: Case Manager checked in with all participants to review discharge dates, insurance authorizations, work-related documents and needs of the treatment team, including medication review and assessment.   Initial Therapeutic Activity: Counselor facilitated therapeutic processing with group members to assess mood and current functioning, prompting group members to share about application of skills, progress and challenges in treatment/personal lives. Client reports that she experienced high anxiety over the weekend, but leaned into her support system to help complete needed tasks. Client expressed feeling guilt about being out of work to take care of her mental health. Counselor provided information on self-compassion. Client presents with moderate depression and severe anxiety. Client denied any current SI/HI/psychosis.   Second Therapeutic Activity: Counselor engaged the group by facilitating a guided imagery, which incorporated deep breathing, visualizations, positive self-talk and progressive muscle relaxation. Counselor read the script as group members participated in the prompting. Client shared that it was very relaxing for her mind and her body, as she is in a constant state of anxiety.   Check Out: Client indicated that they are not currently a risk to self of others. Counselor  prompted group members to share one self-care practice or one productivity activity they can engage in today to reduce anxiety. Client notes that they plan to go to the chiropractor and eat a good meal.   Assessment and Plan: Clinician recommends that Client remain in IOP treatment to better manage mental health symptoms, stabilization and to address treatment plan goals. Clinician recommends adherence to crisis/safety plan, taking medications as prescribed, and following up with medical professionals if any issues arise.   Follow Up Instructions: Clinician will send Webex link for next session. The Client was advised to call back or seek an in-person evaluation if the symptoms worsen or if the condition fails to improve as anticipated.     I provided 180 minutes of non-face-to-face time during this encounter.     Loretta Odor, LCSW

## 2020-09-07 ENCOUNTER — Other Ambulatory Visit: Payer: Self-pay

## 2020-09-07 ENCOUNTER — Other Ambulatory Visit (HOSPITAL_COMMUNITY): Payer: BC Managed Care – PPO | Admitting: Psychiatry

## 2020-09-07 ENCOUNTER — Encounter (HOSPITAL_COMMUNITY): Payer: Self-pay

## 2020-09-07 DIAGNOSIS — F4310 Post-traumatic stress disorder, unspecified: Secondary | ICD-10-CM

## 2020-09-07 DIAGNOSIS — F331 Major depressive disorder, recurrent, moderate: Secondary | ICD-10-CM

## 2020-09-07 DIAGNOSIS — F411 Generalized anxiety disorder: Secondary | ICD-10-CM

## 2020-09-07 NOTE — Progress Notes (Signed)
Virtual Visit via Video Note  I connected with Loretta Wu on 09/07/20 at  9:00 AM EDT by a video enabled telemedicine application and verified that I am speaking with the correct person using two identifiers.  At orientation to the IOP program Case Manager discussed the limitations of evaluation and management by telemedicine and the availability of in person appointments. The patient expressed understanding and agreed to proceed with virtual visits throughout the duration of the program.   Location:  Patient: Patient Home Provider: Home Office   History of Present Illness: MDD  Observations/Objective: Check In: Case Manager checked in with all participants to review discharge dates, insurance authorizations, work-related documents and needs of the treatment team, including medication review and assessment.   Initial Therapeutic Activity: Counselor introduced our guest speaker, Peggye Fothergill, [facility] Pharmacist, who shared about psychiatric medications, side effects, treatment considerations and how to communicate with medical professionals. Group Members asked questions and shared medication concerns. Counselor prompted group members to reference a worksheet called, "Body Scan" to jot down questions and concerns about their physical health in preparation for their upcoming appointments with medical professionals. Client discussed dry eyes, back and neck pain and sleep issues. Counselor encouraged routine medical check-ups, preparing for appointments, following up with recommendations and seeking specialist if needed.    Second Therapeutic Activity: Counselor facilitated therapeutic processing with group members to assess mood and current functioning, prompting group members to share about application of skills, progress and challenges in treatment/personal lives. Client identified obsessive and compulsive behaviors in her interactions with the Case Manager. Client stated that her anxiety has  come down now that insurance provider has responded with approval. Group discussed ways to curb anxiety responses. Client presents with moderate depression and severe anxiety. Client denied any current SI/HI/psychosis.  Check Out: Client indicated that they are not currently a risk to self of others. Counselor prompted group members to share one self-care practice or one productivity activity they can engage in today to reduce anxiety. Client stated they plan to go into the clinic to take and be monitored for her depression medication that is given nasally. She hopes to eat a good meal afterwards then to rest.  Assessment and Plan: Clinician recommends that Client remain in IOP treatment to better manage mental health symptoms, stabilization and to address treatment plan goals. Clinician recommends adherence to crisis/safety plan, taking medications as prescribed, and following up with medical professionals if any issues arise.   Follow Up Instructions: Clinician will send Webex link for next session. The Client was advised to call back or seek an in-person evaluation if the symptoms worsen or if the condition fails to improve as anticipated.     I provided 180 minutes of non-face-to-face time during this encounter.     Hilbert Odor, LCSW

## 2020-09-08 ENCOUNTER — Encounter (HOSPITAL_COMMUNITY): Payer: Self-pay

## 2020-09-08 ENCOUNTER — Other Ambulatory Visit (HOSPITAL_COMMUNITY): Payer: BC Managed Care – PPO | Admitting: Psychiatry

## 2020-09-08 ENCOUNTER — Other Ambulatory Visit: Payer: Self-pay

## 2020-09-08 DIAGNOSIS — F331 Major depressive disorder, recurrent, moderate: Secondary | ICD-10-CM

## 2020-09-08 DIAGNOSIS — F4310 Post-traumatic stress disorder, unspecified: Secondary | ICD-10-CM | POA: Diagnosis not present

## 2020-09-08 NOTE — Progress Notes (Signed)
Virtual Visit via Video Note  I connected with Loretta Wu on 09/08/20 at  9:00 AM EDT by a video enabled telemedicine application and verified that I am speaking with the correct person using two identifiers.  LAt orientation to the IOP program Case Manager discussed the limitations of evaluation and management by telemedicine and the availability of in person appointments. The patient expressed understanding and agreed to proceed with virtual visits throughout the duration of the program.   Location:  Patient: Patient Home Provider: Home Office   History of Present Illness: MDD  Observations/Objective: Check In: Case Manager checked in with all participants to review discharge dates, insurance authorizations, work-related documents and needs of the treatment team, including medication review and assessment. Counselor facilitated therapeutic processing with group members to assess mood and current functioning, prompting group members to share about application of skills, progress and challenges in treatment/personal lives. Client reports feeling "ok" today. Client presents with moderate depression and high anxiety. Client denied any current SI/HI/psychosis.   Initial Therapeutic Activity: Counselor introduced Loretta Wu, [place] Chaplain to present information and discussion on Grief and Loss. Group members engaged in discussion, sharing how grief impacts them, what comforts them, what emotions are felt, labeling losses, etc. After guest speaker logged off, Counselor prompted group to spend 10-15 minutes journaling to process personal grief and loss situations. Counselor processed entries with group and client's identified areas for additional processing in individual therapy. Client noted that she misses her mom and would like to know the root cause of why she abandoned her, stating that it causes trust issues currently.   Second Therapeutic Activity: Counselor presented information  on Cognitive Distortions. Client shared a video outlining 15 styles of cognitive distortions. Group members shared which ones they do most in their thought processes. Client states that she struggles with many of the CDs, specifically jumping to conclusions, control fallacy and obsessive thinking. Counselor shared 5 strategies for combating distorted thinking. Counselor shared links for resources for Group Members to review for homework.   Check Out: Client indicated that they are not currently a risk to self of others. Counselor prompted group members to share one self-care practice or one productivity activity they can engage in today to reduce anxiety. Client stated they plan to go out to eat with a friend and attend a drs appointment.   Assessment and Plan: Clinician recommends that Client remain in IOP treatment to better manage mental health symptoms, stabilization and to address treatment plan goals. Clinician recommends adherence to crisis/safety plan, taking medications as prescribed, and following up with medical professionals if any issues arise.   Follow Up Instructions: Clinician will send Webex link for next session. The Client was advised to call back or seek an in-person evaluation if the symptoms worsen or if the condition fails to improve as anticipated.     I provided 180 minutes of non-face-to-face time during this encounter.     Hilbert Odor, LCSW

## 2020-09-09 ENCOUNTER — Encounter (HOSPITAL_COMMUNITY): Payer: Self-pay

## 2020-09-09 ENCOUNTER — Other Ambulatory Visit: Payer: Self-pay

## 2020-09-09 ENCOUNTER — Other Ambulatory Visit (HOSPITAL_COMMUNITY): Payer: BC Managed Care – PPO | Admitting: Psychiatry

## 2020-09-09 DIAGNOSIS — F4310 Post-traumatic stress disorder, unspecified: Secondary | ICD-10-CM | POA: Diagnosis not present

## 2020-09-09 DIAGNOSIS — F411 Generalized anxiety disorder: Secondary | ICD-10-CM

## 2020-09-09 NOTE — Progress Notes (Signed)
Virtual Visit via Video Note  I connected with Loretta Wu on 09/09/20 at  9:00 AM EDT by a video enabled telemedicine application and verified that I am speaking with the correct person using two identifiers.  At orientation to the IOP program Case Manager discussed the limitations of evaluation and management by telemedicine and the availability of in person appointments. The patient expressed understanding and agreed to proceed with virtual visits throughout the duration of the program.   Location:  Patient: Patient Home Provider: Home Office   History of Present Illness GAD and PTSD  Observations/Objective: Check In: Case Manager checked in with all participants to review discharge dates, insurance authorizations, work-related documents and needs of the treatment team, including medication review and assessment. Counselor facilitated therapeutic processing with group members to assess mood and current functioning, prompting group members to share about application of skills, progress and challenges in treatment/personal lives. Client reports that she felt well enough to spend time with two different friends. Client was also currently getting her house cleaned by a cleaning service for self-care. Client presents with moderate depression and severe anxiety. Client denied any current SI/HI/psychosis.   Initial Therapeutic Activity: Counselor introduced Alphia Moh, representative with [facility] to share about programming. Group Members asked questions and engaged in discussion, as Trinna Post shared about Peer Support, Support Groups and the [employer]. Client stated that they are interested in connecting with the grief and loss group and systems of recovery.   Second Therapeutic Activity: Counselor reviewed homework, Identifying Anxiety Igniting Thoughts Assessment, in relation to Distorted Thinking presentation yesterday. Counselor provided psychoeducation on  intervention to combatting thoughts. Client share that their most prevalent anxious type of thinking is worrying, obsessive thinking and pessimism. Client to discuss more deeply with individual therapists.    Check Out: Counselor closed program by allowing time to celebrate two graduating group members. Counselor shared reflections on progress and allowed space for group members to share well wishes and appreciates to the graduating client. Counselor prompted graduating client to share takeaways, reflect on progress and final thoughts for the group. Client indicated that they are not currently a risk to self of others.   Assessment and Plan: Clinician recommends that Client remain in IOP treatment to better manage mental health symptoms, stabilization and to address treatment plan goals. Clinician recommends adherence to crisis/safety plan, taking medications as prescribed, and following up with medical professionals if any issues arise.   Follow Up Instructions: Clinician will send Webex link for next session. The Client was advised to call back or seek an in-person evaluation if the symptoms worsen or if the condition fails to improve as anticipated.     I provided 180 minutes of non-face-to-face time during this encounter.     Hilbert Odor, LCSW

## 2020-09-12 ENCOUNTER — Encounter (HOSPITAL_COMMUNITY): Payer: Self-pay

## 2020-09-12 ENCOUNTER — Other Ambulatory Visit (HOSPITAL_COMMUNITY): Payer: BC Managed Care – PPO | Admitting: Psychiatry

## 2020-09-12 ENCOUNTER — Other Ambulatory Visit: Payer: Self-pay

## 2020-09-12 DIAGNOSIS — F4310 Post-traumatic stress disorder, unspecified: Secondary | ICD-10-CM | POA: Diagnosis not present

## 2020-09-12 DIAGNOSIS — F411 Generalized anxiety disorder: Secondary | ICD-10-CM

## 2020-09-12 NOTE — Progress Notes (Signed)
Virtual Visit via Video Note  I connected with Loretta Wu on 09/12/20 at  9:00 AM EDT by a video enabled telemedicine application and verified that I am speaking with the correct person using two identifiers.  At orientation to the IOP program Case Manager discussed the limitations of evaluation and management by telemedicine and the availability of in person appointments. The patient expressed understanding and agreed to proceed with virtual visits throughout the duration of the program.   Location:  Patient: Patient Home Provider: Home Office   History of Present Illness: PTSD  Observations/Objective: Check In: Case Manager checked in with all participants to review discharge dates, insurance authorizations, work-related documents and needs of the treatment team, including medication review and assessment. Counselor facilitated therapeutic processing with group members to assess mood and current functioning, prompting group members to share about application of skills, progress and challenges in treatment/personal lives. Client reports that she is feeling "ok", noting that over the weekend she got her hair cut for self-care. Client noted professional and recreational interest that distract her from increasing anxiety and depression. Client presents with moderate depression and high anxiety. Client denied any current SI/HI/psychosis.   Initial Therapeutic Activity: Counselor prompted group members to assess how their anxiety presents vs how their depression presents, and to identify the ways in which the symptoms overlap. Counselor prompted them to reflect on their triggers, symptoms, frequency, duration, etc. Group members shared about their responses, creating more awareness of how their mental health conditions present, in order to determine how to treat and apply coping strategies. Client engaged in discussion, stating that her anxiety feeds into her depression, causing OCD to increase  and impulsivity/irrational decision making.   Second Therapeutic Activity: Counselor shared a psychoeducational video with group members on how Anxiety and Depression are Connected through Avoidance, by Lelon Huh from [employer]. Counselor and Clients engaged in a discussion about how avoidance plays a role in their overall mental health and ways to combat it, to feel more joy, happiness, fulfillment in life-how to get better at feeling, instead of just trying to feel better. Client states that she often avoids difficult situations.   Check Out: Counselor closed program by allowing time to celebrate a graduating group member. Counselor shared reflections on progress and allowed space for group members to share well wishes and appreciates to the graduating client. Counselor prompted graduating client to share takeaways, reflect on progress and final thoughts for the group. Client indicated that they are not currently a risk to self of others before signing off.  Assessment and Plan: Clinician recommends that Client remain in IOP treatment to better manage mental health symptoms, stabilization and to address treatment plan goals. Clinician recommends adherence to crisis/safety plan, taking medications as prescribed, and following up with medical professionals if any issues arise.   Follow Up Instructions: Clinician will send Webex link for next session. The Client was advised to call back or seek an in-person evaluation if the symptoms worsen or if the condition fails to improve as anticipated.     I provided 180 minutes of non-face-to-face time during this encounter.     Hilbert Odor, LCSW

## 2020-09-13 ENCOUNTER — Other Ambulatory Visit (HOSPITAL_COMMUNITY): Payer: BC Managed Care – PPO | Admitting: Psychiatry

## 2020-09-13 ENCOUNTER — Other Ambulatory Visit: Payer: Self-pay

## 2020-09-13 ENCOUNTER — Encounter (HOSPITAL_COMMUNITY): Payer: Self-pay

## 2020-09-13 DIAGNOSIS — F4310 Post-traumatic stress disorder, unspecified: Secondary | ICD-10-CM | POA: Diagnosis not present

## 2020-09-13 DIAGNOSIS — F411 Generalized anxiety disorder: Secondary | ICD-10-CM

## 2020-09-13 NOTE — Progress Notes (Signed)
Virtual Visit via Video Note  I connected with Loretta Wu on 09/13/20 at  9:00 AM EDT by a video enabled telemedicine application and verified that I am speaking with the correct person using two identifiers.  At orientation to the IOP program Case Manager discussed the limitations of evaluation and management by telemedicine and the availability of in person appointments. The patient expressed understanding and agreed to proceed with virtual visits throughout the duration of the program.   Location:  Patient: Patient Home Provider: Home Office   History of Present Illness: PTSD and GAD  Observations/Objective: Check In: Case Manager checked in with all participants to review discharge dates, insurance authorizations, work-related documents and needs of the treatment team, including medication review and assessment. Counselor facilitated therapeutic processing with group members to assess mood and current functioning, prompting group members to share about application of skills, progress and challenges in treatment/personal lives. Client reports that she is not getting good sleep. Client was visibly drowsy and falling asleep during group, due to lack of sleep. Client noted that she has a hard time concentrating when sleep is not well. Client noted being happy about new treatment options for her physical health conditions. Client presents with _ depression and _ anxiety. Client denied any current SI/HI/psychosis.   Initial Therapeutic Activity: Counselor shared 3 worksheets with the group, The Cycle of Depression, The Cycle of Anxiety and The Trauma Brain Cycle. Counselor provided psychoeducation on the cycles and facilitated discussion around how each group member experiences the cycle. Client shared that he depression sparks her anxiety and leads to making impulsive decisions.    Second Therapeutic Activity: Counselor reviewed 8 skills to combat or better cope with anxiety with the group,  challenging them to choose 1 to focus on over the next week. Client chose engage in relaxation. Counselor assigned 2 worksheets for homework on cognitive restructuring for client's to practice and learn to combat depressive and anxious thoughts.   Check Out: Counselor closed program by allowing time to celebrate a graduating group member. Counselor shared reflections on progress and allowed space for group members to share well wishes and appreciates to the graduating client. Counselor prompted graduating client to share takeaways, reflect on progress and final thoughts for the group. Client indicated that they are not currently a risk to self of others before signing off.  Assessment and Plan: Clinician recommends that Client remain in IOP treatment to better manage mental health symptoms, stabilization and to address treatment plan goals. Clinician recommends adherence to crisis/safety plan, taking medications as prescribed, and following up with medical professionals if any issues arise.   Follow Up Instructions: Clinician will send Webex link for next session. The Client was advised to call back or seek an in-person evaluation if the symptoms worsen or if the condition fails to improve as anticipated.     I provided 180 minutes of non-face-to-face time during this encounter.     Hilbert Odor, LCSW

## 2020-09-14 ENCOUNTER — Other Ambulatory Visit: Payer: Self-pay

## 2020-09-14 ENCOUNTER — Other Ambulatory Visit (HOSPITAL_COMMUNITY): Payer: BC Managed Care – PPO | Admitting: Psychiatry

## 2020-09-14 DIAGNOSIS — F411 Generalized anxiety disorder: Secondary | ICD-10-CM

## 2020-09-14 DIAGNOSIS — F4310 Post-traumatic stress disorder, unspecified: Secondary | ICD-10-CM | POA: Diagnosis not present

## 2020-09-14 NOTE — Progress Notes (Signed)
Virtual Visit via Video Note  I connected with Loretta Wu on 09/14/20 at  9:00 AM EDT by a video enabled telemedicine application and verified that I am speaking with the correct person using two identifiers.  At orientation to the IOP program, Case Manager discussed the limitations of evaluation and management by telemedicine and the availability of in person appointments. The patient expressed understanding and agreed to proceed with virtual visits throughout the duration of the program.   Location:  Patient: Patient Home Provider: Home Office   History of Present Illness: PTSD and GAD  Observations/Objective: Check In: Case Manager checked in with all participants to review discharge dates, insurance authorizations, work-related documents and needs of the treatment team, including medication review and assessment. Counselor facilitated therapeutic processing with group members to assess mood and current functioning, prompting group members to share about application of skills, progress and challenges in treatment/personal lives. Client reports that she was unable to get good sleep due to neck and back issues and anxious thoughts. Client allowed self-care by looking into a new product she is interested in and talking with a friend. Client presents with moderate depression and severe anxiety. Client denied any current SI/HI/psychosis.   Initial Therapeutic Activity: Counselor provided group members with a video covering the topics of shame, abandonment, belonging, vulnerability, interpersonal work and relationship dynamics. Counselor prompted Clients to take note of concepts that resonated with them. Counselor explored takeaways with group members, with Client reporting that she feels guilt and shame about events that happened in her childhood. Client worked toward processing and the act of letting go.   Second Therapeutic Activity: Counselor introduced Chief Operating Officer, Clyda Greener,  Director of Wellness to present information on [employer] and Benefits. Clients engaged in activities and discussion, personalizing the information to their individual circumstances. Client motivated to make small changes in wellness practices to improve overall mental health.  Check Out: Counselor closed program by prompting group members to share one idea for self-care or a productivity activity they would like to complete today. Client decided to walk for 15 minutes. Client indicated that they are not currently a risk to self of others before signing off.  Assessment and Plan: Clinician recommends that Client remain in IOP treatment to better manage mental health symptoms, stabilization and to address treatment plan goals. Clinician recommends adherence to crisis/safety plan, taking medications as prescribed, and following up with medical professionals if any issues arise.   Follow Up Instructions: Clinician will send Webex link for next session. The Client was advised to call back or seek an in-person evaluation if the symptoms worsen or if the condition fails to improve as anticipated.     I provided 180 minutes of non-face-to-face time during this encounter.     Hilbert Odor, LCSW

## 2020-09-15 ENCOUNTER — Other Ambulatory Visit (HOSPITAL_COMMUNITY): Payer: BC Managed Care – PPO | Admitting: Psychiatry

## 2020-09-15 ENCOUNTER — Other Ambulatory Visit: Payer: Self-pay

## 2020-09-15 ENCOUNTER — Encounter (HOSPITAL_COMMUNITY): Payer: Self-pay

## 2020-09-15 DIAGNOSIS — F411 Generalized anxiety disorder: Secondary | ICD-10-CM

## 2020-09-15 DIAGNOSIS — F4310 Post-traumatic stress disorder, unspecified: Secondary | ICD-10-CM

## 2020-09-15 NOTE — Progress Notes (Signed)
Virtual Visit via Video Note  I connected with Loretta Wu on 09/15/20 at  9:00 AM EDT by a video enabled telemedicine application and verified that I am speaking with the correct person using two identifiers.  At orientation to the IOP program, Case Manager discussed the limitations of evaluation and management by telemedicine and the availability of in person appointments. The patient expressed understanding and agreed to proceed with virtual visits throughout the duration of the program.   Location:  Patient: Patient Home Provider: Home Office   History of Present Illness: PTSD and GAD  Observations/Objective: Check In: Case Manager checked in with all participants to review discharge dates, insurance authorizations, work-related documents and needs of the treatment team, including medication review and assessment.   Initial Therapeutic Activity: Counselor introduced Sheppard Coil, [place] Chaplain to present information and discussion on Grief and Loss. Group members engaged in discussion, sharing how grief impacts them, what comforts them, what emotions are felt, labeling losses, etc. After guest speaker logged off, Counselor prompted group to spend 10-15 minutes journaling to process personal grief and loss situations. Counselor processed entries with group and client's identified areas for additional processing in individual therapy. Client noted that she would like to do more work on the loss and void of her mother in her life. Group discussed strategies of making a place to mourn/celebrate her, creating a story to have some closure, looking up info on her, connecting with relatives.    Second Therapeutic Activity: Counselor facilitated therapeutic processing with group members to assess mood and current functioning, prompting group members to share about application of skills, progress and challenges in treatment/personal lives. Client reports that she is doing "ok" today. Client  connected with group members and is becoming less anxious in group. Client shared late mother's jewelry with the group and told fond memories of her. Client continues to experience headaches and tiredness after [place] treatments. Client presents with moderate depression and severe anxiety. Client denied any current SI/HI/psychosis.  Check Out: Counselor closed program by prompting group members to share one idea for self-care or a productivity activity they would like to complete today. Client decided to relax and rest for the afternoon. Client indicated that they are not currently a risk to self of others before signing off.  Assessment and Plan: Clinician recommends that Client remain in IOP treatment to better manage mental health symptoms, stabilization and to address treatment plan goals. Clinician recommends adherence to crisis/safety plan, taking medications as prescribed, and following up with medical professionals if any issues arise.   Follow Up Instructions: Clinician will send Webex link for next session. The Client was advised to call back or seek an in-person evaluation if the symptoms worsen or if the condition fails to improve as anticipated.     I provided 180 minutes of non-face-to-face time during this encounter.     Hilbert Odor, LCSW

## 2020-09-16 ENCOUNTER — Other Ambulatory Visit (HOSPITAL_COMMUNITY): Payer: BC Managed Care – PPO | Admitting: Psychiatry

## 2020-09-16 ENCOUNTER — Encounter (HOSPITAL_COMMUNITY): Payer: Self-pay | Admitting: Psychiatry

## 2020-09-16 ENCOUNTER — Other Ambulatory Visit: Payer: Self-pay

## 2020-09-16 ENCOUNTER — Ambulatory Visit (HOSPITAL_COMMUNITY): Payer: BC Managed Care – PPO

## 2020-09-16 DIAGNOSIS — F411 Generalized anxiety disorder: Secondary | ICD-10-CM

## 2020-09-16 DIAGNOSIS — F4310 Post-traumatic stress disorder, unspecified: Secondary | ICD-10-CM | POA: Diagnosis not present

## 2020-09-16 NOTE — Progress Notes (Signed)
Virtual Visit via Video Note  I connected with Loretta Wu on 09/16/20 at  9:00 AM EDT by a video enabled telemedicine application and verified that I am speaking with the correct person using two identifiers.  At orientation to the IOP program, Case Manager discussed the limitations of evaluation and management by telemedicine and the availability of in person appointments. The patient expressed understanding and agreed to proceed with virtual visits throughout the duration of the program.   Location:  Patient: Patient Home Provider: Home Office   History of Present Illness: MDD  Observations/Objective: Check In: Case Manager checked in with all participants to review discharge dates, insurance authorizations, work-related documents and needs of the treatment team, including medication review and assessment.   Initial Therapeutic Activity: Counselor facilitated therapeutic processing with group members to assess mood and current functioning, prompting group members to share about application of skills, progress and challenges in treatment/personal lives. Client reports that she is feeling good today, because she decided to reach out to family she has become distant with, and they responded positively. Client appreciative for the support and encouragement from the group. Client notes having a bad dream during the night, which sparked a panic attack, which she recovered from by taking medications as prescribed and efforts to relax until it subsided. Client presents with moderate depression and severe anxiety. Client denied any current SI/HI/psychosis.   Second Therapeutic Activity: Counselor provided time for group members to share items from their home that signify growth and progress towards their personal goals, a show and share session. Each group member took time to collect and present items, sharing their significance, while group members engaged in discussion in relevant topics  connected to the items. Client shared about shared one of the only pictures of her and her mother. Client went into detail about her efforts to overcome the affects of abandonment and cold parenting she received. Client engaged well while others shared.   Check Out: Counselor closed program by prompting group members to share one idea for self-care or a productivity activity they would like to complete over the weekend. Client plans to make watching her favorite football team and spending time with a friend a priority for personal enrichment. Client indicated that they are not currently a risk to self of others before signing off.  Assessment and Plan: Clinician recommends that Client remain in IOP treatment to better manage mental health symptoms, stabilization and to address treatment plan goals. Clinician recommends adherence to crisis/safety plan, taking medications as prescribed, and following up with medical professionals if any issues arise.   Follow Up Instructions: Clinician will send Webex link for next session. The Client was advised to call back or seek an in-person evaluation if the symptoms worsen or if the condition fails to improve as anticipated.     I provided 180 minutes of non-face-to-face time during this encounter.     Hilbert Odor, LCSW

## 2020-09-19 ENCOUNTER — Other Ambulatory Visit (HOSPITAL_COMMUNITY): Payer: BC Managed Care – PPO | Admitting: Psychiatry

## 2020-09-19 ENCOUNTER — Ambulatory Visit (HOSPITAL_COMMUNITY): Payer: BC Managed Care – PPO

## 2020-09-19 ENCOUNTER — Encounter (HOSPITAL_COMMUNITY): Payer: Self-pay

## 2020-09-19 ENCOUNTER — Other Ambulatory Visit: Payer: Self-pay

## 2020-09-19 DIAGNOSIS — F4310 Post-traumatic stress disorder, unspecified: Secondary | ICD-10-CM | POA: Diagnosis not present

## 2020-09-19 DIAGNOSIS — F411 Generalized anxiety disorder: Secondary | ICD-10-CM

## 2020-09-19 NOTE — Progress Notes (Signed)
Virtual Visit via Video Note  I connected with Loretta Wu on 09/19/20 at  9:00 AM EDT by a video enabled telemedicine application and verified that I am speaking with the correct person using two identifiers.  At orientation to the IOP program, Case Manager discussed the limitations of evaluation and management by telemedicine and the availability of in person appointments. The patient expressed understanding and agreed to proceed with virtual visits throughout the duration of the program.   Location:  Patient: Patient Home Provider: Home Office   History of Present Illness: PTSD and GAD  Observations/Objective: Check In: Case Manager checked in with all participants to review discharge dates, insurance authorizations, work-related documents and needs of the treatment team, including medication review and assessment. Counselor facilitated therapeutic processing with group members to assess mood and current functioning, prompting group members to share about application of skills, progress and challenges in treatment/personal lives. Client reports that she had a good weekend and enjoyed downtime with housemates and friends. Client noted fighting feelings of lack of motivation, panic attacks and anxiety. Counselor and group provided strategies to combat current symptoms. Client open to trying them out. Client presents with moderate depression and moderate anxiety. Client denied any current SI/HI/psychosis.   Initial Therapeutic Activity: Counselor prompted group to make a list of ways they can show more love to themselves. Counselor prompted clients to share their reflections. Group members shared and discussed ways to show and promote self-love/prioritize self. Client identified that she can take her dogs out more for a longer walk for exercise and fresh air, rediscover her strengths, give herself a treat, offer herself forgiveness, and allow herself to feel the feelings as they  come.   Second Therapeutic Activity: Counselor prompted group members to share their thoughts and knowledge on core beliefs before providing psycho education on core beliefs and how harmful core beliefs can cause negative impacts on mental health. Client remarked that people can't be trusted, that she is worthless, she's weak and a doormat. Client able to come up with alternative ways to thinking to challenge core belief.  Check Out: Counselor closed program by prompting group members to share one idea for self-care or a productivity activity they would like to engage in today. Client notes that they plan to take dog on longer walk and go to the chiropractor. Client indicated that they are not currently a risk to self of others before signing off.  Assessment and Plan: Clinician recommends that Client remain in IOP treatment to better manage mental health symptoms, stabilization and to address treatment plan goals. Clinician recommends adherence to crisis/safety plan, taking medications as prescribed, and following up with medical professionals if any issues arise.   Follow Up Instructions: Clinician will send Webex link for next session. The Client was advised to call back or seek an in-person evaluation if the symptoms worsen or if the condition fails to improve as anticipated.     I provided 180 minutes of non-face-to-face time during this encounter.     Hilbert Odor, LCSW

## 2020-09-20 ENCOUNTER — Other Ambulatory Visit (HOSPITAL_COMMUNITY): Payer: BC Managed Care – PPO | Admitting: Psychiatry

## 2020-09-20 ENCOUNTER — Other Ambulatory Visit: Payer: Self-pay

## 2020-09-20 ENCOUNTER — Encounter (HOSPITAL_COMMUNITY): Payer: Self-pay

## 2020-09-20 DIAGNOSIS — F411 Generalized anxiety disorder: Secondary | ICD-10-CM

## 2020-09-20 DIAGNOSIS — F4310 Post-traumatic stress disorder, unspecified: Secondary | ICD-10-CM

## 2020-09-20 NOTE — Progress Notes (Signed)
Virtual Visit via Video Note  I connected with Loretta Wu on 09/20/20 at  9:00 AM EDT by a video enabled telemedicine application and verified that I am speaking with the correct person using two identifiers.  At orientation to the IOP program, Case Manager discussed the limitations of evaluation and management by telemedicine and the availability of in person appointments. The patient expressed understanding and agreed to proceed with virtual visits throughout the duration of the program.   Location:  Patient: Patient Home Provider: Home Office   History of Present Illness: PTSD and GAD  Observations/Objective: Check In: Case Manager checked in with all participants to review discharge dates, insurance authorizations, work-related documents and needs of the treatment team, including medication review and assessment. Counselor facilitated therapeutic processing with group members to assess mood and current functioning, prompting group members to share about application of skills, progress and challenges in treatment/personal lives. Client reports that she struggles with loneliness, isolation and depression from lack of a friend group. Client discussed increase in anxiety due to her housemates hoarding situation, not feeling comfortable and relaxed in her own home.Group discussed strategies to address. Client reports that she implemented self-care plan from yesterday of going for a walk.  Client presents with moderate depression and moderate anxiety. Client denied any current SI/HI/psychosis.   Initial Therapeutic Activity: Counselor started our discussion on boundaries by walking group through the [employer], to see books resources and free materials. Counselor prompted group members to take the Boundaries Quiz. Group members shared their scores, with Client scoring an 8. Counselor discussed recommendation based on the websites resource content. Client shared about the areas  where they struggle with boundaries. Client notes that she flip flops from one extreme to the other.    Second Therapeutic Activity: Counselor provided psychoeducation on terms related to boundary setting and the effects of poor boundary setting using two handouts from [employer]. Counselor prompted group members to identify what kind of boundaries they have in various areas of their life, rigid, porous or healthy. Counselor discussed cultural, and other considerations that impact boundary etiquette. Clients discussed ways they can implement healthier boundaries in various settings. Counselor assigned homework for client's to complete the worksheet to report back tomorrow. Client notes that she is more rigid than porous due to her trauma history and trust issues.  Check Out: Counselor closed program by prompting group members to share one idea for self-care or a productivity activity they would like to engage in today. Client notes that they plan to grocery shop and walk with dog.. Client indicated that they are not currently a risk to self of others before signing off.  Assessment and Plan: Clinician recommends that Client remain in IOP treatment to better manage mental health symptoms, stabilization and to address treatment plan goals. Clinician recommends adherence to crisis/safety plan, taking medications as prescribed, and following up with medical professionals if any issues arise.   Follow Up Instructions: Clinician will send Webex link for next session. The Client was advised to call back or seek an in-person evaluation if the symptoms worsen or if the condition fails to improve as anticipated.     I provided 180 minutes of non-face-to-face time during this encounter.     Hilbert Odor, LCSW

## 2020-09-21 ENCOUNTER — Other Ambulatory Visit: Payer: Self-pay

## 2020-09-21 ENCOUNTER — Encounter (HOSPITAL_COMMUNITY): Payer: Self-pay

## 2020-09-21 ENCOUNTER — Other Ambulatory Visit (HOSPITAL_COMMUNITY): Payer: BC Managed Care – PPO | Admitting: Psychiatry

## 2020-09-21 DIAGNOSIS — F411 Generalized anxiety disorder: Secondary | ICD-10-CM

## 2020-09-21 DIAGNOSIS — F4310 Post-traumatic stress disorder, unspecified: Secondary | ICD-10-CM

## 2020-09-21 NOTE — Progress Notes (Signed)
Virtual Visit via Video Note  I connected with Loretta Wu on 09/21/20 at  9:00 AM EDT by a video enabled telemedicine application and verified that I am speaking with the correct person using two identifiers.  At orientation to the IOP program, Case Manager discussed the limitations of evaluation and management by telemedicine and the availability of in person appointments. The patient expressed understanding and agreed to proceed with virtual visits throughout the duration of the program.   Location:  Patient: Patient Home Provider: Home Office   History of Present Illness: PTSD and GAD  Observations/Objective: Check In: Case Manager checked in with all participants to review discharge dates, insurance authorizations, work-related documents and needs of the treatment team, including medication review and assessment. Counselor facilitated therapeutic processing with group members to assess mood and current functioning, prompting group members to share about application of skills, progress and challenges in treatment/personal lives. Client reports that she challenged herself to engage in exercising on a stationary bike. Client reports feeling good about moving body and exercising. Client presents with moderate depression and moderate anxiety. Client denied any current SI/HI/psychosis.   Initial Therapeutic Activity: Counselor introduced our guest speaker, Peggye Fothergill, [facility] Pharmacist, who shared about psychiatric medications, side effects, treatment considerations and how to communicate with medical professionals. Group Members asked questions and shared medication concerns. Counselor prompted group members to reference a worksheet called, "Body Scan" to jot down questions and concerns about their physical health in preparation for their upcoming appointments with medical professionals. Client states that she has neck, back GI issues, low energy, racing thoughts and trouble sleeping due to  physical symptoms. Client notes being set up with needed providers for future appointments. Counselor encouraged routine medical check-ups, preparing for appointments, following up with recommendations and seeking specialist if needed.    Second Therapeutic Activity: Counselor reviewed material/homework from yesterday and presented new psychoeducation on healthy boundary setting. Counselor shared examples of healthy boundary setting and role-played scenarios with group members for them to practice their own skill building in this area. Client stated that she would like to be less rigid in order to make friends, working on her trust issues. She expressed regret with rigidity in relationship with her late father.  Check Out: Counselor closed program by prompting group members to share one boundary they can set, an idea for self-care or a productivity activity they would like to engage in today. Client notes that they plan to ride her exercise bike again, and to relax in the evening in order to get better sleep. Client indicated that they are not currently a risk to self of others before signing off.  Assessment and Plan: Clinician recommends that Client remain in IOP treatment to better manage mental health symptoms, stabilization and to address treatment plan goals. Clinician recommends adherence to crisis/safety plan, taking medications as prescribed, and following up with medical professionals if any issues arise.   Follow Up Instructions: Clinician will send Webex link for next session. The Client was advised to call back or seek an in-person evaluation if the symptoms worsen or if the condition fails to improve as anticipated.     I provided 180 minutes of non-face-to-face time during this encounter.     Hilbert Odor, LCSW

## 2020-09-22 ENCOUNTER — Other Ambulatory Visit (HOSPITAL_COMMUNITY): Payer: BC Managed Care – PPO | Admitting: Psychiatry

## 2020-09-22 ENCOUNTER — Other Ambulatory Visit: Payer: Self-pay

## 2020-09-22 ENCOUNTER — Encounter (HOSPITAL_COMMUNITY): Payer: Self-pay

## 2020-09-22 DIAGNOSIS — F4310 Post-traumatic stress disorder, unspecified: Secondary | ICD-10-CM

## 2020-09-22 DIAGNOSIS — F411 Generalized anxiety disorder: Secondary | ICD-10-CM

## 2020-09-22 NOTE — Progress Notes (Addendum)
Virtual Visit via Video Note  I connected with Loretta Wu on 09/22/20 at  9:00 AM EDT by a video enabled telemedicine application and verified that I am speaking with the correct person using two identifiers.  At orientation to the IOP program, Case Manager discussed the limitations of evaluation and management by telemedicine and the availability of in person appointments. The patient expressed understanding and agreed to proceed with virtual visits throughout the duration of the program.   Location:  Patient: Patient Home Provider: Home Office   History of Present Illness: PTSD and GAD  Observations/Objective: Check In: Case Manager checked in with all participants to review discharge dates, insurance authorizations, work-related documents and needs of the treatment team, including medication review and assessment. Counselor facilitated therapeutic processing with group members to assess mood and current functioning, prompting group members to share about application of skills, progress and challenges in treatment/personal lives. Client reports that she is feeling "down" and experiencing increases anxiety. Counselor prompted to share trigger or root cause, with client stating that she is unsure. Counselor explored potential options. Client presents with moderate depression and severe anxiety. Client denied any current SI/HI/psychosis.   Initial Therapeutic Activity: Counselor introduced Sheppard Coil, [place] Chaplain to present information and discussion on Grief and Loss. Group members engaged in discussion, sharing how grief impacts them, what comforts them, what emotions are felt, labeling losses, etc. After guest speaker logged off, Counselor prompted group to spend 10-15 minutes journaling to process personal grief and loss situations. Counselor processed entries with group and client's identified areas for additional processing in individual therapy. Client noted the loss of  identity with the changes in her job and being out of work for several months. Group discussed Client looking into [email or web address] for reconnecting with her late mothers side of the family. Client expressed interest.  Second Therapeutic Activity: Counselor introduced Alphia Moh, representative with [facility] to share about programming. Group Members asked questions and engaged in discussion, as Trinna Post shared about Peer Support, Support Groups and the [employer]. Client stated that they are interested in connecting with the [employer]. Additionally, Counselor did a round table on current coping skills that are working for each group member, with client sharing that she uses fidget and stress ball type items to control anxiety.   Check Out: Counselor closed program by prompting group members to share one boundary they can set, an idea for self-care or a productivity activity they would like to engage in today. Client notes that they plan to walk outdoor with her dogs. Client indicated that they are not currently a risk to self of others before signing off.  Assessment and Plan: Clinician recommends that Client remain in IOP treatment to better manage mental health symptoms, stabilization and to address treatment plan goals. Clinician recommends adherence to crisis/safety plan, taking medications as prescribed, and following up with medical professionals if any issues arise.   Follow Up Instructions: Clinician will send Webex link for next session. The Client was advised to call back or seek an in-person evaluation if the symptoms worsen or if the condition fails to improve as anticipated.     I provided 180 minutes of non-face-to-face time during this encounter.     Hilbert Odor, LCSW

## 2020-09-23 ENCOUNTER — Encounter (HOSPITAL_COMMUNITY): Payer: Self-pay

## 2020-09-23 ENCOUNTER — Other Ambulatory Visit (HOSPITAL_COMMUNITY): Payer: BC Managed Care – PPO | Admitting: Psychiatry

## 2020-09-23 ENCOUNTER — Other Ambulatory Visit: Payer: Self-pay

## 2020-09-23 DIAGNOSIS — F4310 Post-traumatic stress disorder, unspecified: Secondary | ICD-10-CM | POA: Diagnosis not present

## 2020-09-23 DIAGNOSIS — F411 Generalized anxiety disorder: Secondary | ICD-10-CM

## 2020-09-23 NOTE — Progress Notes (Signed)
Virtual Visit via Video Note  I connected with Loretta Wu on 09/23/20 at  9:00 AM EDT by a video enabled telemedicine application and verified that I am speaking with the correct person using two identifiers.  At orientation to the IOP program, Case Manager discussed the limitations of evaluation and management by telemedicine and the availability of in person appointments. The patient expressed understanding and agreed to proceed with virtual visits throughout the duration of the program.   Location:  Patient: Patient Home Provider: Home Office   History of Present Illness: PTSD and GAD  Observations/Objective: Check In: Case Manager checked in with all participants to review discharge dates, insurance authorizations, work-related documents and needs of the treatment team, including medication review and assessment. Counselor facilitated therapeutic processing with group members to assess mood and current functioning, prompting group members to share about application of skills, progress and challenges in treatment/personal lives. Client reports that she is feeling "ok and a little down" today. Client realized that mood decline is related to abrupt stop in medication due to dr being on vacation, unable to supervise dosage and reaction in the office. Client deciding whether or not she would like to continue on depression medication. Client presents with moderate depression and moderate anxiety. Client denied any current SI/HI/psychosis.   Initial Therapeutic Activity: Counselor engaged the group in an ice breaker activity. All group members shared their favorite family night board game and what animal they would be if they were born an animal. Client stated that she would be a white panda at a zoo because because they get held and cared for and that her favorite family game is uno and phase ten.   Second Therapeutic Activity: Counselor prepared the group to a guided imagery relaxation  script entitled, Positive Body Image Relaxation. It included deep breathing exercises, progressive muscle relaxation, positive affirmations, and visualizations. Client fully participated in the activity, stating afterwards that she enjoyed the distracted at times, but enjoyed the breathing and felt like she would want to practice relaxation skills more often.   Check Out: Counselor closed program by allowing time to celebrate a graduating group member. Counselor shared reflections on progress and allow space for group members to share well wishes and appreciates to the graduating client. Counselor prompted graduating client to share takeaways, reflect on progress and final thoughts for the group. Lastly, Counselor invited group members to share one idea for self-care or a productivity activity they would like to engage in over the weekend. Client notes that they plan to spend time with a friend for fun. Client indicated that they are not currently a risk to self of others before signing off.  Assessment and Plan: Clinician recommends that Client remain in IOP treatment to better manage mental health symptoms, stabilization and to address treatment plan goals. Clinician recommends adherence to crisis/safety plan, taking medications as prescribed, and following up with medical professionals if any issues arise.   Follow Up Instructions: Clinician will send Webex link for next session. The Client was advised to call back or seek an in-person evaluation if the symptoms worsen or if the condition fails to improve as anticipated.     I provided 180 minutes of non-face-to-face time during this encounter.     Hilbert Odor, LCSW

## 2020-09-26 ENCOUNTER — Other Ambulatory Visit (HOSPITAL_COMMUNITY): Payer: BC Managed Care – PPO | Admitting: Psychiatry

## 2020-09-26 ENCOUNTER — Encounter (HOSPITAL_COMMUNITY): Payer: Self-pay

## 2020-09-26 ENCOUNTER — Other Ambulatory Visit: Payer: Self-pay

## 2020-09-26 DIAGNOSIS — F411 Generalized anxiety disorder: Secondary | ICD-10-CM

## 2020-09-26 DIAGNOSIS — F4310 Post-traumatic stress disorder, unspecified: Secondary | ICD-10-CM | POA: Diagnosis not present

## 2020-09-26 NOTE — Progress Notes (Signed)
Virtual Visit via Video Note  I connected with Loretta Wu on 09/26/20 at  9:00 AM EDT by a video enabled telemedicine application and verified that I am speaking with the correct person using two identifiers.  At orientation to the IOP program, Case Manager discussed the limitations of evaluation and management by telemedicine and the availability of in person appointments. The patient expressed understanding and agreed to proceed with virtual visits throughout the duration of the program.   Location:  Patient: Patient Home Provider: Home Office   History of Present Illness: PTSD  Observations/Objective: Check In: Case Manager checked in with all participants to review discharge dates, insurance authorizations, work-related documents and needs of the treatment team, including medication review and assessment. Case Manager introduced 2 new group members, with the group welcoming and introducing selves.    Initial Therapeutic Activity: Counselor facilitated therapeutic processing with group members to assess mood and current functioning, prompting group members to share about application of skills, progress and challenges in treatment/personal lives. Client reports that she was able to spend time with friends to reduce depression and loneliness. Client also communicated with a former group member to practice combating her social anxieties. Client identified feeling tired due to lack of sleep. Client drowsy and nodding off during todays session. Client adjusting to being off medication. Client presents with moderate depression and severe anxiety. Client denied any current SI/HI/psychosis.  Second Therapeutic Activity: Counselor prompted group members to share one area in their lives that they would consider a "bad habit" or problem area. Client mentioned issues surrounding impulse shopping. Counselor proceeded with sharing psychoeducation on the "Self-Care Wellness Wheel". Counselor prompted  clients to look through 2 exhaustive lists of coping skills to identify ones they can use to prevent mental health episodes and to utilize during a mental health episode/crisis. Client identified reading more, going out, exercising, swimming, connecting with friends as coping skills they would like to reincorporate into their lives.   Check Out: Counselor invited group members to share one phone call they could make to a friend, family or important call they need to make today to reduce anxiety. Client stated that they would call to look into getting her mortgage refinanced to alleviate some financial strain. Client indicated that they are not currently a risk to self of others before signing off.  Assessment and Plan: Clinician recommends that Client remain in IOP treatment to better manage mental health symptoms, stabilization and to address treatment plan goals. Clinician recommends adherence to crisis/safety plan, taking medications as prescribed, and following up with medical professionals if any issues arise.   Follow Up Instructions: Clinician will send Webex link for next session. The Client was advised to call back or seek an in-person evaluation if the symptoms worsen or if the condition fails to improve as anticipated.     I provided 180 minutes of non-face-to-face time during this encounter.     Hilbert Odor, LCSW

## 2020-09-27 ENCOUNTER — Other Ambulatory Visit: Payer: Self-pay

## 2020-09-27 ENCOUNTER — Other Ambulatory Visit (HOSPITAL_COMMUNITY): Payer: BC Managed Care – PPO

## 2020-09-28 ENCOUNTER — Other Ambulatory Visit (HOSPITAL_COMMUNITY): Payer: BC Managed Care – PPO | Admitting: Psychiatry

## 2020-09-28 ENCOUNTER — Other Ambulatory Visit: Payer: Self-pay

## 2020-09-28 ENCOUNTER — Ambulatory Visit (HOSPITAL_COMMUNITY): Payer: BC Managed Care – PPO

## 2020-09-28 ENCOUNTER — Encounter (HOSPITAL_COMMUNITY): Payer: Self-pay

## 2020-09-28 DIAGNOSIS — F411 Generalized anxiety disorder: Secondary | ICD-10-CM

## 2020-09-28 DIAGNOSIS — F4310 Post-traumatic stress disorder, unspecified: Secondary | ICD-10-CM | POA: Diagnosis not present

## 2020-09-28 NOTE — Progress Notes (Signed)
Virtual Visit via Video Note  I connected with Loretta Wu on 09/28/20 at  9:00 AM EDT by a video enabled telemedicine application and verified that I am speaking with the correct person using two identifiers.  [place]: [name] to [place] IOP program, Case Manager discussed the limitations of evaluation and management by telemedicine and the availability of in person appointments. The patient expressed understanding and agreed to proceed with virtual visits throughout the duration of the program.   Location:  Patient: Patient Home Provider: Home Office   History of Present Illness: PTSD and GAD  Observations/Objective: Check In: Case Manager checked in with all participants to review discharge dates, insurance authorizations, work-related documents and needs of the treatment team, including medication review and assessment. Counselor facilitated therapeutic processing with group members to assess mood and current functioning, prompting group members to share about application of skills, progress and challenges in treatment/personal lives. Client reports that she is feeling increased anxiety with anticipation of group ending and increased depressive symptoms due to lapse in depression medication. Client concerned about disability being extended/approved and how to effectively fill her days without work or IOP group. Client reports she has a meeting established with [facility] to engage in their programing. Counselor and group brainstormed ideas on ways to structure her day to decrease negative symptoms. Client presents with moderate depression and moderate anxiety. Client denied any current SI/HI/psychosis.   Initial Therapeutic Activity: Counselor recapped the discussion on Self-Esteem from the previous session. Counselor prompted group members who were absent to share their results and their thoughts on self-esteem. Client stated that they scored a 17 out of 30,  with anything below 15 indicated low self-esteem.  Counselor presented the group with a video highlighting 20 strategies to increasing self-esteem. Counselor prompted clients to choose at least 1 that they could do today-this week as a new strategy. Client stated that they were interested in trying to watch video clips that make her laugh and brings her joy.    Second Therapeutic Activity: Counselor shared an additional video with 2 activities to do for more self-improvement/exploration and to increase self-esteem, self-portrait, a vision board and creating positive affirmation cards. Counselor allowed time for group members to great positive affirmations and to share a few they were most meaningful to them. Client shared   Check Out: Counselor invited group members to share one self-care practice or important call they need to make today to reduce anxiety. Client plans to take a long nap. Client indicated that they are not currently a risk to self of others before signing off.  Assessment and Plan: Clinician recommends that Client remain in IOP treatment to better manage mental health symptoms, stabilization and to address treatment plan goals. Clinician recommends adherence to crisis/safety plan, taking medications as prescribed, and following up with medical professionals if any issues arise.   Follow Up Instructions: Clinician will send Webex link for next session. The Client was advised to call back or seek an in-person evaluation if the symptoms worsen or if the condition fails to improve as anticipated.     I provided 180 minutes of non-face-to-face time during this encounter.     Hilbert Odor, LCSW

## 2020-09-29 ENCOUNTER — Other Ambulatory Visit (HOSPITAL_COMMUNITY): Payer: BC Managed Care – PPO

## 2020-09-29 ENCOUNTER — Other Ambulatory Visit: Payer: Self-pay

## 2020-09-30 ENCOUNTER — Ambulatory Visit (HOSPITAL_COMMUNITY): Payer: BC Managed Care – PPO

## 2020-09-30 ENCOUNTER — Encounter (HOSPITAL_COMMUNITY): Payer: Self-pay

## 2020-09-30 ENCOUNTER — Other Ambulatory Visit: Payer: Self-pay

## 2020-09-30 ENCOUNTER — Other Ambulatory Visit (HOSPITAL_COMMUNITY): Payer: BC Managed Care – PPO | Attending: Psychiatry | Admitting: Psychiatry

## 2020-09-30 DIAGNOSIS — F331 Major depressive disorder, recurrent, moderate: Secondary | ICD-10-CM

## 2020-09-30 DIAGNOSIS — I10 Essential (primary) hypertension: Secondary | ICD-10-CM | POA: Diagnosis not present

## 2020-09-30 DIAGNOSIS — F4310 Post-traumatic stress disorder, unspecified: Secondary | ICD-10-CM

## 2020-09-30 DIAGNOSIS — F419 Anxiety disorder, unspecified: Secondary | ICD-10-CM | POA: Insufficient documentation

## 2020-09-30 DIAGNOSIS — F411 Generalized anxiety disorder: Secondary | ICD-10-CM

## 2020-09-30 DIAGNOSIS — Z79899 Other long term (current) drug therapy: Secondary | ICD-10-CM | POA: Diagnosis not present

## 2020-09-30 DIAGNOSIS — F32A Depression, unspecified: Secondary | ICD-10-CM | POA: Diagnosis not present

## 2020-09-30 NOTE — Progress Notes (Signed)
Virtual Visit via Video Note  I connected with Loretta Wu on 09/30/20 at  9:00 AM EDT by a video enabled telemedicine application and verified that I am speaking with the correct person using two identifiers.  At orientation to the IOP program, Case Manager discussed the limitations of evaluation and management by telemedicine and the availability of in person appointments. The patient expressed understanding and agreed to proceed with virtual visits throughout the duration of the program.   Location:  Patient: Patient Home Provider: Home Office   History of Present Illness: PTSD and GAD  Observations/Objective: Check In: Case Manager checked in with all participants to review discharge dates, insurance authorizations, work-related documents and needs of the treatment team, including medication review and assessment. Counselor facilitated therapeutic processing with group members to assess mood and current functioning, prompting group members to share about application of skills, progress and challenges in treatment/personal lives. Client reports that she is "feeling down". Client desires to restart anti-depressant to address depressive thoughts. Client plans to spend time engaging in her favorite pass time and being with friends to maintain over the weekend until she can meet with her provider again. Client presents with moderate depression and moderate anxiety. Client denied any current SI/HI/psychosis.   Initial Therapeutic Activity: Counselor prompted group members to journal/reflect on the topic of forgiveness, beliefs, definition, experiences, etc. Counselor presented a worksheet on Forgiveness to shed light on researchers definition to expand their definition and thoughts related to the topic. Counselor introduced a video on the act of Practicing Forgiveness by a Psychiatric nurse. Counselor prompted group members to share feedback on their reaction to implementing skill of  practicing forgiveness in their lives for themselves and others. Client discussed the painful emotions that are connected with forgiveness for her parents who are no longer alive.    Second Therapeutic Activity: Counselor walked client's through the remainder of the worksheet to start the 4 phases of Forgiveness Work. Counselor allowed group members time to verbally and in writing, process and complete some of the steps. Counselor encouraged group members to complete worksheet for homework, start the stages with themselves and potentially identifying one person they could extend forgiveness, to alleviate their own emotional turmoil.   Check Out: Counselor closed program by allowing time to celebrate a graduating group member. Counselor shared reflections on progress and allow space for group members to share well wishes and appreciates to the graduating client. Counselor prompted graduating client to share takeaways, reflect on progress and final thoughts for the group. Client indicated that they are not currently a risk to self of others before signing off.  Assessment and Plan: Clinician recommends that Client remain in IOP treatment to better manage mental health symptoms, stabilization and to address treatment plan goals. Clinician recommends adherence to crisis/safety plan, taking medications as prescribed, and following up with medical professionals if any issues arise.   Follow Up Instructions: Clinician will send Webex link for next session. The Client was advised to call back or seek an in-person evaluation if the symptoms worsen or if the condition fails to improve as anticipated.     I provided 180 minutes of non-face-to-face time during this encounter.     Hilbert Odor, LCSW

## 2020-10-03 ENCOUNTER — Other Ambulatory Visit (HOSPITAL_COMMUNITY): Payer: BC Managed Care – PPO

## 2020-10-03 ENCOUNTER — Other Ambulatory Visit: Payer: Self-pay

## 2020-10-04 ENCOUNTER — Ambulatory Visit (HOSPITAL_COMMUNITY): Payer: BC Managed Care – PPO

## 2020-10-05 ENCOUNTER — Other Ambulatory Visit (HOSPITAL_COMMUNITY): Payer: BC Managed Care – PPO

## 2020-10-05 ENCOUNTER — Other Ambulatory Visit: Payer: Self-pay

## 2020-10-06 ENCOUNTER — Encounter (HOSPITAL_COMMUNITY): Payer: Self-pay

## 2020-10-06 ENCOUNTER — Other Ambulatory Visit: Payer: Self-pay

## 2020-10-06 ENCOUNTER — Ambulatory Visit (HOSPITAL_COMMUNITY): Payer: BC Managed Care – PPO

## 2020-10-06 ENCOUNTER — Other Ambulatory Visit (HOSPITAL_COMMUNITY): Payer: BC Managed Care – PPO | Admitting: Psychiatry

## 2020-10-06 DIAGNOSIS — F32A Depression, unspecified: Secondary | ICD-10-CM | POA: Diagnosis not present

## 2020-10-06 DIAGNOSIS — F4310 Post-traumatic stress disorder, unspecified: Secondary | ICD-10-CM

## 2020-10-06 DIAGNOSIS — F411 Generalized anxiety disorder: Secondary | ICD-10-CM

## 2020-10-06 NOTE — Progress Notes (Signed)
Virtual Visit via Video Note  I connected with Loretta Wu on 10/06/20 at  9:00 AM EDT by a video enabled telemedicine application and verified that I am speaking with the correct person using two identifiers.  At orientation to the IOP program, Case Manager discussed the limitations of evaluation and management by telemedicine and the availability of in person appointments. The patient expressed understanding and agreed to proceed with virtual visits throughout the duration of the program.   Location:  Patient: Patient Home Provider: Home Office   History of Present Illness: PTSD and GAD  Observations/Objective: Check In: Case Manager checked in with all participants to review discharge dates, insurance authorizations, work-related documents and needs of the treatment team, including medication review and assessment. Counselor facilitated therapeutic processing with group members to assess mood and current functioning, prompting group members to share about application of skills, progress and challenges in treatment/personal lives. Client reports that she has attended mental health appointments over the past 3 days, sharing doctor and program recommendations for medication changes and linkage to ongoing support groups. Client noted increased anxiety about treatment ending this week, inability to function in a work capacity and a feeling of tiredness today. Client presents with moderate depression and moderate anxiety. Client denied any current SI/HI/psychosis.   Initial Therapeutic Activity: Counselor introduced [employer], [place] Chaplain to present information and discussion on Grief and Loss. Group members engaged in discussion, sharing how grief impacts them, what comforts them, what emotions are felt, labeling losses, etc. Client noted issues related to loss of identity as an employee and residual losses from being out of work.    Second Therapeutic Activity: After guest speaker  logged off, Counselor prompted group to spend 10-15 minutes journaling to process personal grief and loss situations. Counselor provided 3 Grief Activity Worksheets for the clients to choose from to complete while reflecting, in addition to 3 other worksheets about the grief and loss process. Counselor processed entries with group and client's identified areas for additional processing in individual therapy. Client shared her work on the sentence completion about where she is in grief and loss over father and mother.   Check Out: Counselor closed program by allowing time to celebrate a graduating group member. Counselor shared reflections on progress and allow space for group members to share well wishes and appreciates to the graduating client. Counselor prompted graduating client to share takeaways, reflect on progress and final thoughts for the group. Client indicated that they are not currently a risk to self of others before signing off.  Assessment and Plan: Clinician recommends that Client remain in IOP treatment to better manage mental health symptoms, stabilization and to address treatment plan goals. Clinician recommends adherence to crisis/safety plan, taking medications as prescribed, and following up with medical professionals if any issues arise.   Follow Up Instructions: Clinician will send Webex link for next session. The Client was advised to call back or seek an in-person evaluation if the symptoms worsen or if the condition fails to improve as anticipated.     I provided 180 minutes of non-face-to-face time during this encounter.     Hilbert Odor, LCSW

## 2020-10-07 ENCOUNTER — Encounter (HOSPITAL_COMMUNITY): Payer: Self-pay | Admitting: Family

## 2020-10-07 ENCOUNTER — Ambulatory Visit (HOSPITAL_COMMUNITY): Payer: BC Managed Care – PPO

## 2020-10-07 ENCOUNTER — Other Ambulatory Visit: Payer: Self-pay

## 2020-10-07 ENCOUNTER — Other Ambulatory Visit (HOSPITAL_COMMUNITY): Payer: BC Managed Care – PPO | Admitting: Psychiatry

## 2020-10-07 DIAGNOSIS — F32A Depression, unspecified: Secondary | ICD-10-CM | POA: Diagnosis not present

## 2020-10-07 DIAGNOSIS — F411 Generalized anxiety disorder: Secondary | ICD-10-CM

## 2020-10-07 DIAGNOSIS — F4310 Post-traumatic stress disorder, unspecified: Secondary | ICD-10-CM

## 2020-10-07 NOTE — Patient Instructions (Signed)
D:  Patient completed MH-IOP today.  A:  Discharge today.  Follow up with Hurley Cisco, LCSW on 10-13-20 and Dr. [name] on 10-31-20.  Strongly recommended support groups thru [facility].  R:  Patient receptive.

## 2020-10-07 NOTE — Progress Notes (Signed)
Virtual Visit via Video Note  I connected with Loretta Wu on 10/07/20 at  9:00 AM EDT by a video enabled telemedicine application and verified that I am speaking with the correct person using two identifiers.  At orientation to the IOP program, Case Manager discussed the limitations of evaluation and management by telemedicine and the availability of in person appointments. The patient expressed understanding and agreed to proceed with virtual visits throughout the duration of the program.   Location:  Patient: Patient Home Provider: Home Office   History of Present Illness: PTSD and GAD  Observations/Objective: Check In: Case Manager checked in with all participants to review discharge dates, insurance authorizations, work-related documents and needs of the treatment team, including medication review and assessment. Counselor facilitated therapeutic processing with group members to assess mood and current functioning, prompting group members to share about application of skills, progress and challenges in treatment/personal lives. Client reports that she is dealing with conflicting feelings of being proud of self and anxious that her time in treatment is ending today. Client notes improved overall mood with medication restarted with psychiatrist. Client presents with moderate depression and moderate anxiety. Client denied any current SI/HI/psychosis.   Initial Therapeutic Activity: Counselor prompted the group to reflect and journal on the following prompt: Where do you see yourself in 5 years? 10 years? Group members shared that for all of them, that it was difficult to visualize because their last year has not gone in a way they envisioned. Client specifically noted, that she would like to move to a more practical home and find somewhere to volunteer. Counselor then shared a video by Ainsley Spinner about moving out of out negative emotional home, getting out of being comfortable so we can  move into fulfillment and purpose in life. Client noted their takeaways from video were that she can give herself permission to feel better and different than depressed or anxious.   Second Therapeutic Activity: Counselor shared psychoeducation from an article from Ainsley Spinner website on 12 Ways to Find Your Purpose and Meaning In Life. Counselor walked group members through strategies to apply to their individual situations. Client noted that they plan to find a way to celebrate and signify the work she has accomplished on her job for past 25 years and to look at meaningful experiences in the future based on the discussion and new learnings.   Check Out: Counselor closed program by allowing time to celebrate a graduating group member. Counselor shared reflections on progress and allow space for group members to share well wishes and appreciates to the graduating client. Counselor prompted graduating client to share takeaways, reflect on progress and final thoughts for the group. Client noted that one of her goals was to face her social anxieties, and she did as she participated in each session, connected with group members, inside and out of group and feels more comfortable in participating in future group treatments.  Client indicated that they are not currently a risk to self of others before signing off.  Assessment and Plan: Clinician recommends that Client step down from IOP treatment to medication management, individual therapy and to participate in programming at [facility].  Clinician recommends adherence to crisis/safety plan, taking medications as prescribed, and following up with medical professionals if any issues arise.   Follow Up Instructions: Clinician will send Webex link for next session. The Client was advised to call back or seek an in-person evaluation if the symptoms worsen or if the condition fails  to improve as anticipated.     I provided 180 minutes of non-face-to-face time during  this encounter.     Hilbert Odor, LCSW

## 2020-10-07 NOTE — Progress Notes (Addendum)
Virtual Visit via Video Note  I connected with Loretta Wu on @TODAY @ at 0830 by a video enabled telemedicine application and verified that I am speaking with the correct person using two identifiers.   I discussed the limitations of evaluation and management by telemedicine and the availability of in person appointments. The patient expressed understanding and agreed to proceed.   I discussed the assessment and treatment plan with the patient. The patient was provided an opportunity to ask questions and all were answered. The patient agreed with the plan and demonstrated an understanding of the instructions.   The patient was advised to call back or seek an in-person evaluation if the symptoms worsen or if the condition fails to improve as anticipated.  I provided 25 minutes of non-face-to-face time during this encounter.  Patient ID: Loretta Wu, female   DOB: 12/26/1961, 59 y.o.   MRN: [id number] D:  As per previous CCA states:  This is a 59 yr old, divorced, employed, Caucasian female who was referred per Dr. 59; treatment for depressive and anxiety symptoms.  According to pt, her symptoms worsened June 2021.  Triggers:  1) Job ([employer]) of 25 yrs.  Pt works from home d/t pandemic; been out on short term disability since 06-28-20.  Pt c/o unrealistic quotas and a lot of micro-managing.  2)  Living situation:  resides with ex-husband.  He moved back in to assist pt financially.  3) Medical Issues:  Arthritis, IBS, Small bladder issues, HTN, Interstitial Cystitis.  Pt denies any prior psychiatric hospitalizations or suicide attempts/gestures.  Has been seeing Dr. 01-01-1991 and [name], LCSW for a couple of yrs.  Family hx:  Mom (depressed with prior sucide attempt: OD). Patient's Currently Reported Symptoms/Problems: Poor concentration, flucuating sleep, irritability, crying spells, no motivation, anhedonia, poor energy, sadness, anxious, isolative, ruminating thoughts  Pt  attended all 26 MH-IOP days.  Reports overall feeling a little better.  Pt continues to struggle with anxiety.  Denies SI/HI or A/V hallucinations.  States that Dr. Hurley Cisco has started her on Lithium; she took the first dose yesterday.  "I will be stopping the [place] next week."  Pt is seeking long term disability.  States she has applied for social security disability already. Pt reports that the groups were helpful.  "It's going to be a big change without having the group."  On a scale of 1-10 (10 being the worst); pt rated her depression #8 and anxiety #9.  A:  D/C today.  Follow up with Dr. [name] on 10-31-20 and 12-11-2000, LCSW on 10-13-20.  Dr. 10-15-20 to take over the short term disability.  Strongly recommended support groups through [facility].  R:  Pt receptive.  [name], M.Ed,CNA

## 2020-10-07 NOTE — Progress Notes (Signed)
Virtual Visit via Telephone Note  I connected with Loretta Wu on 10/07/20 at  9:00 AM EDT by telephone and verified that I am speaking with the correct person using two identifiers.   I discussed the limitations, risks, security and privacy concerns of performing an evaluation and management service by telephone and the availability of in person appointments. I also discussed with the patient that there may be a patient responsible charge related to this service. The patient expressed understanding and agreed to proceed.    I discussed the assessment and treatment plan with the patient. The patient was provided an opportunity to ask questions and all were answered. The patient agreed with the plan and demonstrated an understanding of the instructions.   The patient was advised to call back or seek an in-person evaluation if the symptoms worsen or if the condition fails to improve as anticipated.  I provided 15 minutes of non-face-to-face time during this encounter.   Oneta Rack, NP   [facility] Discharge Summary  Loretta Wu [id number]  Admission date: 08/25/2020 Discharge date: 10/07/2020  Reason for admission: Per admission assessment note T-his is a 59 yr old, divorced, employed, Caucasian female who was referred per Dr. [name]; treatment for depressive and anxiety symptoms. According to pt, her symptoms worsened June 2021. Triggers: 1) Job ([employer]) of 25 yrs. Pt works from home d/t pandemic; been out on short term disability since 06-28-20. Pt c/o unrealistic quotas and a lot of micro-managing. 2) Living situation: resides with ex-husband. He moved back in to assist pt financially. 3) Medical Issues: Arthritis, IBS, Small bladder issues, HTN, Interstitial Cystitis. Pt denies any prior psychiatric hospitalizations or suicide attempts/gestures. Has been seeing Dr. [name] and Hurley Cisco, LCSW for a couple of  yrs. Patient's Currently Reported Symptoms/Problems: Poor concentration, flucuating sleep, irritability, crying spells, no motivation, anhedonia, poor energy, sadness, anxious, isolative, ruminating thoughts  Progress in Program Toward Treatment Goals: Ongoing, patient attended and participated with daily group session with active and engaged participation.  She reports recent events initiated on lithium for mood stabilization.  She reports a follow-up appointment with her attending psychiatrist November 1.  Reviewed medication side effects.  She denied suicidal or homicidal ideations.  Denies auditory or visual hallucinations.  Support encouragement reassurance was provided.  Progress (rationale): Keep follow-up appointment with psychiatrist [facility] Nov 1. and  Hurley Cisco therapist   Take all medications as prescribed. Keep all follow-up appointments as scheduled.  Do not consume alcohol or use illegal drugs while on prescription medications. Report any adverse effects from your medications to your primary care provider promptly.  In the event of recurrent symptoms or worsening symptoms, call 911, a crisis hotline, or go to the nearest emergency department for evaluation.   Oneta Rack, NP 10/07/2020

## 2020-10-10 ENCOUNTER — Ambulatory Visit (HOSPITAL_COMMUNITY): Payer: BC Managed Care – PPO

## 2020-10-11 ENCOUNTER — Ambulatory Visit (INDEPENDENT_AMBULATORY_CARE_PROVIDER_SITE_OTHER): Payer: BC Managed Care – PPO | Admitting: Psychiatry

## 2020-10-11 ENCOUNTER — Ambulatory Visit (HOSPITAL_COMMUNITY): Payer: BC Managed Care – PPO

## 2020-10-11 ENCOUNTER — Other Ambulatory Visit: Payer: Self-pay

## 2020-10-11 ENCOUNTER — Encounter (HOSPITAL_COMMUNITY): Payer: Self-pay | Admitting: Psychiatry

## 2020-10-11 DIAGNOSIS — F411 Generalized anxiety disorder: Secondary | ICD-10-CM

## 2020-10-11 NOTE — Progress Notes (Signed)
Virtual Visit via Video Note  I connected with Loretta Wu on 10/11/20 at 12:00 PM EDT by a video enabled telemedicine application and verified that I am speaking with the correct person using two identifiers.  Location: Patient: Patient Home Provider: Home Office   GROUP GOAL: Client will attend IOP Aftercare Group Therapy 2-4x a month to connect with peers and to apply strategies discussed to improve mental health condition.  History of Present Illness: PTSD and GAD  Observations/Objective: Counselor met with Patient in the context of Group Therapy, via Webex. Counselor prompted Patient to share updates on coping skill application and individual therapeutic process in management of mental health. Client reports that mood has stabilized with restarting her medications. Counselor prompted Patient to share areas of concern, challenges and identify barriers to meeting current goals. Client noted that she would like feedback from the group about a few big decisions she has coming up. Client shared about having a limited support system and feeling lonely in this season of her life. Group members offered support and ideas for Client to consider. Client was appreciative. Topics covered: advocating for [place] on the job, process of making "big" decisions, medication issues, provider issues, setting boundaries, self-care, and grief and loss. Patient engaged in discussion, provided feedback for others within the group and took note of additional strategies reviewed and discussed.   Assessment and Plan: Counselor recommends client continue following treatment plan goals, following crisis plan and following up with behavioral health and medical providers as needed. Patient is welcome to join group at next session.   Follow Up Instructions: Counselor to provide link for next session via Webex platform. The patient was advised to call back or seek an in-person evaluation if the symptoms worsen or if the  condition fails to improve as anticipated.  I provided 70 minutes of non-face-to-face time during this encounter.   Lise Auer, LCSW

## 2020-10-12 ENCOUNTER — Ambulatory Visit (HOSPITAL_COMMUNITY): Payer: BC Managed Care – PPO

## 2020-10-13 ENCOUNTER — Ambulatory Visit (HOSPITAL_COMMUNITY): Payer: BC Managed Care – PPO

## 2020-10-14 ENCOUNTER — Ambulatory Visit (HOSPITAL_COMMUNITY): Payer: BC Managed Care – PPO

## 2020-10-18 ENCOUNTER — Ambulatory Visit (INDEPENDENT_AMBULATORY_CARE_PROVIDER_SITE_OTHER): Payer: BC Managed Care – PPO | Admitting: Psychiatry

## 2020-10-18 DIAGNOSIS — F411 Generalized anxiety disorder: Secondary | ICD-10-CM

## 2020-10-18 DIAGNOSIS — F4310 Post-traumatic stress disorder, unspecified: Secondary | ICD-10-CM

## 2020-10-19 ENCOUNTER — Other Ambulatory Visit: Payer: Self-pay

## 2020-10-20 ENCOUNTER — Encounter (HOSPITAL_COMMUNITY): Payer: Self-pay | Admitting: Psychiatry

## 2020-10-20 NOTE — Progress Notes (Signed)
Virtual Visit via Video Note  I connected with Loretta Wu on 10/20/20 at 12:00 PM EDT by a video enabled telemedicine application and verified that I am speaking with the correct person using two identifiers.  Location: Patient: Patient Home Provider: Home Office   GROUP GOAL: Client will attend IOP Aftercare Group Therapy 2-4x a month to connect with peers and to apply strategies discussed to improve mental health condition.  History of Present Illness: GAD and PTSD  Observations/Objective: Counselor met with Patient in the context of Group Therapy, via Webex. Counselor prompted Patient to share updates on coping skill application and individual therapeutic process in management of mental health. Client shared about attempts to reduce anxiety by engaging in researching solutions to two huge issues/decisions she has upcoming. Client shared details with group members, gaining feedback to consider in making an informed decision.  Counselor prompted Patient to share areas of concern, challenges and identify barriers to meeting current goals. Client reports increased anxiety and depressive symptoms due to changes in medication, requesting long term disability and financial uncertainties. Topics covered: job stressors, communicating needs, healing from broken relationships, forgiveness, and decision making. Patient engaged in discussion, provided feedback for others within the group and took note of additional strategies reviewed and discussed.   Assessment and Plan: Counselor recommends client continue following treatment plan goals, following crisis plan and following up with behavioral health and medical providers as needed. Patient is welcome to join group at next session.   Follow Up Instructions: Counselor to provide link for next session via Webex platform. The patient was advised to call back or seek an in-person evaluation if the symptoms worsen or if the condition fails to improve  as anticipated.  I provided 65 minutes of non-face-to-face time during this encounter.   Lise Auer, LCSW

## 2020-10-25 ENCOUNTER — Ambulatory Visit (INDEPENDENT_AMBULATORY_CARE_PROVIDER_SITE_OTHER): Payer: BC Managed Care – PPO | Admitting: Psychiatry

## 2020-10-25 ENCOUNTER — Other Ambulatory Visit: Payer: Self-pay

## 2020-10-25 ENCOUNTER — Encounter (HOSPITAL_COMMUNITY): Payer: Self-pay | Admitting: Psychiatry

## 2020-10-25 DIAGNOSIS — F4310 Post-traumatic stress disorder, unspecified: Secondary | ICD-10-CM | POA: Diagnosis not present

## 2020-10-25 DIAGNOSIS — F411 Generalized anxiety disorder: Secondary | ICD-10-CM

## 2020-10-25 NOTE — Progress Notes (Signed)
Virtual Visit via Video Note  I connected with Loretta Wu on 10/25/20 at 12:00 PM EDT by a video enabled telemedicine application and verified that I am speaking with the correct person using two identifiers.  Location: Patient: Patient Home Provider: Home Office   GROUP GOAL: Client will attend IOP Aftercare Group Therapy 2-4x a month to connect with peers and to apply strategies discussed to improve mental health condition.  History of Present Illness: PTSD  Observations/Objective: Counselor met with Patient in the context of Group Therapy, via Webex. Counselor prompted Patient to share updates on coping skill application and individual therapeutic process in management of mental health. Client making steps towards securing housing to reduce anxiety about her future. Counselor prompted Patient to share areas of concern, challenges and identify barriers to meeting current goals. Client overwhelmed with decision making and processing a variety of opinions from people in her life. Client shared concerns with group for feedback. Topics covered: lack of support, MH misunderstood by others, communicating wants/needs/expectations, grief and loss, and managing bad days/major depression. Patient engaged in discussion, provided feedback for others within the group and took note of additional strategies reviewed and discussed.   Assessment and Plan: Counselor recommends client continue following treatment plan goals, following crisis plan and following up with behavioral health and medical providers as needed. Patient is welcome to join group at next session.   Follow Up Instructions: Counselor to provide link for next session via Webex platform. The patient was advised to call back or seek an in-person evaluation if the symptoms worsen or if the condition fails to improve as anticipated.  I provided 80 minutes of non-face-to-face time during this encounter.   Lise Auer, LCSW

## 2020-11-08 ENCOUNTER — Encounter (HOSPITAL_COMMUNITY): Payer: Self-pay | Admitting: Psychiatry

## 2020-11-08 ENCOUNTER — Other Ambulatory Visit: Payer: Self-pay

## 2020-11-08 ENCOUNTER — Ambulatory Visit (INDEPENDENT_AMBULATORY_CARE_PROVIDER_SITE_OTHER): Payer: BC Managed Care – PPO | Admitting: Psychiatry

## 2020-11-08 DIAGNOSIS — F411 Generalized anxiety disorder: Secondary | ICD-10-CM

## 2020-11-08 DIAGNOSIS — F4310 Post-traumatic stress disorder, unspecified: Secondary | ICD-10-CM

## 2020-11-08 NOTE — Progress Notes (Signed)
Virtual Visit via Video Note  I connected with Loretta Wu on 11/08/20 at 12:00 PM EST by a video enabled telemedicine application and verified that I am speaking with the correct person using two identifiers.  Location: Patient: Patient Home Provider: Home Office   GROUP GOAL: Client will attend IOP Aftercare Group Therapy 2-4x a month to connect with peers and to apply strategies discussed to improve mental health condition.  History of Present Illness: MDD/ PTSD  Observations/Objective: Counselor met with Patient in the context of Group Therapy, via Webex. Counselor prompted Patient to share updates on coping skill application and individual therapeutic process in management of mental health. Client reports utilizing her support system to communicate about a concerning situation, which resulted in them offering to purchase her a new home. Client proud of self for connecting with family and allowing others into her life. Counselor prompted Patient to share areas of concern, challenges and identify barriers to meeting current goals. Client expressed stress related to setting boundaries and communicating assertively with family members who are offering to help. Client experiencing mixed emotions about process. Client plans to express her needs in upcoming interactions. Topics covered: disenfranchised grief, mindfulness practices to address addictions, grief and loss, anger, healing from broken relationships. Patient engaged in discussion, provided feedback for others within the group and took note of additional strategies reviewed and discussed.   Assessment and Plan: Counselor recommends client continue following treatment plan goals, following crisis plan and following up with behavioral health and medical providers as needed. Patient is welcome to join group at next session.   Follow Up Instructions: Counselor to provide link for next session via Webex platform. The patient was advised  to call back or seek an in-person evaluation if the symptoms worsen or if the condition fails to improve as anticipated.  I provided 70 minutes of non-face-to-face time during this encounter.   Lise Auer, LCSW

## 2020-11-15 ENCOUNTER — Encounter (HOSPITAL_COMMUNITY): Payer: Self-pay | Admitting: Psychiatry

## 2020-11-15 ENCOUNTER — Other Ambulatory Visit: Payer: Self-pay

## 2020-11-15 ENCOUNTER — Ambulatory Visit (INDEPENDENT_AMBULATORY_CARE_PROVIDER_SITE_OTHER): Payer: BC Managed Care – PPO | Admitting: Psychiatry

## 2020-11-15 DIAGNOSIS — F411 Generalized anxiety disorder: Secondary | ICD-10-CM

## 2020-11-15 NOTE — Progress Notes (Signed)
Virtual Visit via Video Note  I connected with Loretta Wu on 11/15/20 at 12:00 PM EST by a video enabled telemedicine application and verified that I am speaking with the correct person using two identifiers.  Location: Patient: Patient Home Provider: Home Office   GROUP GOAL: Client will attend IOP Aftercare Group Therapy 2-4x a month to connect with peers and to apply strategies discussed to improve mental health condition.  History of Present Illness: GAD  Observations/Objective: Counselor met with Patient in the context of Group Therapy, via Webex. Counselor prompted Patient to share updates on coping skill application and individual therapeutic process in management of mental health. Client shared that she is becoming more assertive in communications with family members in relation to her house search. Client presents with decreased depression and a lift in flat affect. Counselor prompted Patient to share areas of concern, challenges and identify barriers to meeting current goals. Client expressed fears about being unable to find appropriate housing to meet her needs as she ages. Client receptive in positive and encouraging feedback given by the group. Topics covered: engaging with support system in hard times, relationship dynamics, coping with grief, feelings of loneliness, and other's misunderstanding mental health. Patient engaged in discussion, provided feedback for others within the group and took note of additional strategies reviewed and discussed.   Assessment and Plan: Counselor recommends client continue following treatment plan goals, following crisis plan and following up with behavioral health and medical providers as needed. Patient is welcome to join group at next session.   Follow Up Instructions: Counselor to provide link for next session via Webex platform. The patient was advised to call back or seek an in-person evaluation if the symptoms worsen or if the  condition fails to improve as anticipated.  I provided 80 minutes of non-face-to-face time during this encounter.   Lise Auer, LCSW

## 2020-11-22 ENCOUNTER — Encounter (HOSPITAL_COMMUNITY): Payer: Self-pay | Admitting: Psychiatry

## 2020-11-22 ENCOUNTER — Ambulatory Visit (INDEPENDENT_AMBULATORY_CARE_PROVIDER_SITE_OTHER): Payer: BC Managed Care – PPO | Admitting: Psychiatry

## 2020-11-22 DIAGNOSIS — F4310 Post-traumatic stress disorder, unspecified: Secondary | ICD-10-CM

## 2020-11-22 DIAGNOSIS — F411 Generalized anxiety disorder: Secondary | ICD-10-CM

## 2020-11-22 NOTE — Progress Notes (Addendum)
Virtual Visit via Video Note  I connected with Loretta Wu on 11/22/20 at 12:00 PM EST by a video enabled telemedicine application and verified that I am speaking with the correct person using two identifiers.  Location: Patient: Patient Home Provider: Home Office   GROUP GOAL: Client will attend IOP Aftercare Group Therapy 2-4x a month to connect with peers and to apply strategies discussed to improve mental health condition.  History of Present Illness: GAD  Observations/Objective: Counselor met with Patient in the context of Group Therapy, via Webex. Counselor prompted Patient to share updates on coping skill application and individual therapeutic process in management of mental health. Client reports being proud of self for making a decision to close on a house, a decision that has caused severe anxiety for several months. Client shared communications skills and strategies used to make an informed decision. Counselor prompted Patient to share areas of concern, challenges and identify barriers to meeting current goals. Client expressed anxious thoughts about the moving process, feeling overwhlemed with the amount of work it will take and how to navigate pressures from others involved. Counselor recommends client make a detailed plan and timeline to provide more structure in the process. Topics covered: engaging with support system in hard times, relationship dynamics, coping with grief, feelings of loneliness, and other's misunderstanding mental health. Patient engaged in discussion, provided feedback for others within the group and took note of additional strategies reviewed and discussed.   Assessment and Plan: Counselor recommends client continue following treatment plan goals, following crisis plan and following up with behavioral health and medical providers as needed. Patient is welcome to join group at next session.   Follow Up Instructions: Counselor to provide link for next  session via Webex platform. The patient was advised to call back or seek an in-person evaluation if the symptoms worsen or if the condition fails to improve as anticipated.  I provided 70 minutes of non-face-to-face time during this encounter.   Lise Auer, LCSW

## 2020-11-29 ENCOUNTER — Encounter (HOSPITAL_COMMUNITY): Payer: Self-pay | Admitting: Psychiatry

## 2020-11-29 ENCOUNTER — Ambulatory Visit (INDEPENDENT_AMBULATORY_CARE_PROVIDER_SITE_OTHER): Payer: BC Managed Care – PPO | Admitting: Psychiatry

## 2020-11-29 DIAGNOSIS — F4310 Post-traumatic stress disorder, unspecified: Secondary | ICD-10-CM

## 2020-11-29 DIAGNOSIS — F411 Generalized anxiety disorder: Secondary | ICD-10-CM

## 2020-11-29 NOTE — Progress Notes (Signed)
Virtual Visit via Video Note  I connected with Tuesday Terlecki on 11/29/20 at 12:00 PM EST by a video enabled telemedicine application and verified that I am speaking with the correct person using two identifiers.  Location: Patient: Patient Home Provider: Home Office   GROUP GOAL: Client will attend IOP Aftercare Group Therapy 2-4x a month to connect with peers and to apply strategies discussed to improve mental health condition.  History of Present Illness: GAD  Observations/Objective: Counselor met with Patient in the context of Group Therapy, via Webex. Counselor prompted Patient to share updates on coping skill application and individual therapeutic process in management of mental health. Client notes being self-aware of feelings and more willing to communicate with others about solutions. Counselor prompted Patient to share areas of concern, challenges and identify barriers to meeting current goals. Client reports being anxious, increased depression/feelings of being stuck, overwhelmed and increased occurences of crying spells in relation to moving into her new home. Client willing to reach out to her support system to alleviate stress and pressure of completing tasks and making decisions alone. Topics covered: work related issues due to mental health, grief and loss, depression, anxiety, communication skills, medication side effects and anger issues. Patient engaged in discussion, provided feedback for others within the group and took note of additional strategies reviewed and discussed.   Assessment and Plan: Counselor recommends client continue following treatment plan goals, following crisis plan and following up with behavioral health and medical providers as needed. Patient is welcome to join group at next session.   Follow Up Instructions: Counselor to provide link for next session via Webex platform. The patient was advised to call back or seek an in-person evaluation if the  symptoms worsen or if the condition fails to improve as anticipated.  I provided 65 minutes of non-face-to-face time during this encounter.   Lise Auer, LCSW

## 2020-12-06 ENCOUNTER — Ambulatory Visit (INDEPENDENT_AMBULATORY_CARE_PROVIDER_SITE_OTHER): Payer: BC Managed Care – PPO | Admitting: Psychiatry

## 2020-12-06 ENCOUNTER — Encounter (HOSPITAL_COMMUNITY): Payer: Self-pay | Admitting: Psychiatry

## 2020-12-06 ENCOUNTER — Other Ambulatory Visit: Payer: Self-pay

## 2020-12-06 DIAGNOSIS — F411 Generalized anxiety disorder: Secondary | ICD-10-CM | POA: Diagnosis not present

## 2020-12-06 NOTE — Progress Notes (Signed)
Virtual Visit via Video Note  I connected with Loretta Wu on 12/06/20 at 12:00 PM EST by a video enabled telemedicine application and verified that I am speaking with the correct person using two identifiers.  Location: Patient: Patient Home Provider: Home Office   GROUP GOAL: Client will attend IOP Aftercare Group Therapy 2-4x a month to connect with peers and to apply strategies discussed to improve mental health condition.  History of Present Illness: GAD  Observations/Objective: Counselor met with Patient in the context of Group Therapy, via Webex. Counselor prompted Patient to share updates on coping skill application and individual therapeutic process in management of mental health. Client discussed appreciation of support group helping with packing for her move. Client acknowledges growth in ability to express needs and allow self to trust and connect with others. Counselor prompted Patient to share areas of concern, challenges and identify barriers to meeting current goals. Client continues to have stress about the totality of her move, setting appropriate boundaries with partner and trusting her decision making. Topics covered: positive self talk, self-forgiveness, transitions, seeking professional and specialized treatments. Patient engaged in discussion, provided feedback for others within the group and took note of additional strategies reviewed and discussed.   Assessment and Plan: Counselor recommends client continue following treatment plan goals, following crisis plan and following up with behavioral health and medical providers as needed. Patient is welcome to join group at next session.   Follow Up Instructions: Counselor to provide link for next session via Webex platform. The patient was advised to call back or seek an in-person evaluation if the symptoms worsen or if the condition fails to improve as anticipated.  I provided 65 minutes of non-face-to-face time  during this encounter.   Lise Auer, LCSW

## 2020-12-13 ENCOUNTER — Ambulatory Visit (INDEPENDENT_AMBULATORY_CARE_PROVIDER_SITE_OTHER): Payer: BC Managed Care – PPO | Admitting: Psychiatry

## 2020-12-13 ENCOUNTER — Encounter (HOSPITAL_COMMUNITY): Payer: Self-pay | Admitting: Psychiatry

## 2020-12-13 DIAGNOSIS — F4310 Post-traumatic stress disorder, unspecified: Secondary | ICD-10-CM | POA: Diagnosis not present

## 2020-12-13 DIAGNOSIS — F411 Generalized anxiety disorder: Secondary | ICD-10-CM | POA: Diagnosis not present

## 2020-12-13 NOTE — Progress Notes (Signed)
Virtual Visit via Video Note  I connected with Loretta Wu on 12/13/20 at 12:00 PM EST by a video enabled telemedicine application and verified that I am speaking with the correct person using two identifiers.  Location: Patient: Patient Home Provider: Home Office   GROUP GOAL: Client will attend IOP Aftercare Group Therapy 2-4x a month to connect with peers and to apply strategies discussed to improve mental health condition.  History of Present Illness: GAD  Observations/Objective: Counselor met with Patient in the context of Group Therapy, via Webex. Counselor prompted Patient to share updates on coping skill application and individual therapeutic process in management of mental health. Client stated that she is moving forward with her move and major life transition, attempting to balance all aspects. Client making efforts to keep connected with friends and a routine to keep her grounded. Counselor prompted Patient to share areas of concern, challenges and identify barriers to meeting current goals. Client requested feedback from group on how to communicate on a deeper level with relatives, she recently reconnected with. Client role played options with group for feedback. Topics covered: holiday blues/triggers, grief and loss, navigating parent - adult child relationships, childhood traumas and life transitions. Patient engaged in discussion, provided feedback for others within the group and took note of additional strategies reviewed and discussed.   Assessment and Plan: Counselor recommends client continue following treatment plan goals, following crisis plan and following up with behavioral health and medical providers as needed. Patient is welcome to join group at next session.   Follow Up Instructions: Counselor to provide link for next session via Webex platform. The patient was advised to call back or seek an in-person evaluation if the symptoms worsen or if the condition fails  to improve as anticipated.  I provided 65 minutes of non-face-to-face time during this encounter.   Lise Auer, LCSW

## 2020-12-20 ENCOUNTER — Encounter (HOSPITAL_COMMUNITY): Payer: Self-pay | Admitting: Psychiatry

## 2020-12-20 ENCOUNTER — Ambulatory Visit (INDEPENDENT_AMBULATORY_CARE_PROVIDER_SITE_OTHER): Payer: BC Managed Care – PPO | Admitting: Psychiatry

## 2020-12-20 DIAGNOSIS — F411 Generalized anxiety disorder: Secondary | ICD-10-CM | POA: Diagnosis not present

## 2020-12-20 NOTE — Progress Notes (Signed)
Virtual Visit via Video Note  I connected with Loretta Wu on 12/20/20 at 12:00 PM EST by a video enabled telemedicine application and verified that I am speaking with the correct person using two identifiers.  Location: Patient: Patient Home Provider: Home Office   GROUP GOAL: Client will attend IOP Aftercare Group Therapy 2-4x a month to connect with peers and to apply strategies discussed to improve mental health condition.  History of Present Illness: GAD  Observations/Objective: Counselor met with Patient in the context of Group Therapy, via Webex. Counselor prompted Patient to share updates on coping skill application and individual therapeutic process in management of mental health. Client asked for feedback from peers about a situation involving a long-term friendship and challenges with communication/managing feelings. Client identified that sharing her concerns within the group as growth, since she normally would put the pressure on herself to know everything.  Counselor prompted Patient to share areas of concern, challenges and identify barriers to meeting current goals. Client reports that she closed on her house, but continues to feel overwhelmed by the remaining tasks in moving and selling her own home. Client working to slow down and take baby steps rather than working on all tasks at once. Topics covered: coping skills, communication skills, couples counseling, and stage of life transitions/stressors. Patient engaged in discussion, provided feedback for others within the group and took note of additional strategies reviewed and discussed.   Assessment and Plan: Counselor recommends client continue following treatment plan goals, following crisis plan and following up with behavioral health and medical providers as needed. Patient is welcome to join group at next session.   Follow Up Instructions: Counselor to provide link for next session via Webex platform. The patient  was advised to call back or seek an in-person evaluation if the symptoms worsen or if the condition fails to improve as anticipated.  I provided 90 minutes of non-face-to-face time during this encounter.   Lise Auer, LCSW

## 2020-12-27 ENCOUNTER — Ambulatory Visit (INDEPENDENT_AMBULATORY_CARE_PROVIDER_SITE_OTHER): Payer: BC Managed Care – PPO | Admitting: Psychiatry

## 2020-12-27 ENCOUNTER — Encounter (HOSPITAL_COMMUNITY): Payer: Self-pay | Admitting: Psychiatry

## 2020-12-27 DIAGNOSIS — F411 Generalized anxiety disorder: Secondary | ICD-10-CM

## 2020-12-27 NOTE — Progress Notes (Signed)
Virtual Visit via Video Note  I connected with Loretta Wu on 12/27/20 at 12:00 PM EST by a video enabled telemedicine application and verified that I am speaking with the correct person using two identifiers.  I discussed the limitations of evaluation and management by telemedicine and the availability of in person appointments. The patient expressed understanding and agreed to proceed.    Location: Patient: Patient Home Provider: Home Office   GROUP GOAL: Client will attend IOP Aftercare Group Therapy 2-4x a month to connect with peers and to apply strategies discussed to improve mental health condition.  History of Present Illness: GAD  Observations/Objective: Counselor met with Patient in the context of Group Therapy, via Webex. Counselor prompted Patient to share updates on coping skill application and individual therapeutic process in management of mental health. Counselor prompted Patient to share areas of concern, challenges and identify barriers to meeting current goals. Topics covered: psychiatry and medication management, family dynamics, communication skills and management of sobriety. Patient engaged in discussion, provided feedback for others within the group and took note of additional strategies reviewed and discussed.   Assessment and Plan: Counselor recommends client continue following treatment plan goals, following crisis plan and following up with behavioral health and medical providers as needed. Patient is welcome to join group at next session.   Follow Up Instructions: Counselor to provide link for next session via Webex platform. The patient was advised to call back or seek an in-person evaluation if the symptoms worsen or if the condition fails to improve as anticipated.  I provided 70 minutes of non-face-to-face time during this encounter.   Lise Auer, LCSW

## 2021-01-10 ENCOUNTER — Other Ambulatory Visit: Payer: Self-pay

## 2021-01-10 ENCOUNTER — Encounter (HOSPITAL_COMMUNITY): Payer: Self-pay | Admitting: Psychiatry

## 2021-01-10 ENCOUNTER — Ambulatory Visit (INDEPENDENT_AMBULATORY_CARE_PROVIDER_SITE_OTHER): Payer: BC Managed Care – PPO | Admitting: Psychiatry

## 2021-01-10 DIAGNOSIS — F411 Generalized anxiety disorder: Secondary | ICD-10-CM

## 2021-01-10 NOTE — Progress Notes (Signed)
Virtual Visit via Video Note  I connected with Loretta Wu on 01/10/21 at 12:00 PM EST by a video enabled telemedicine application and verified that I am speaking with the correct person using two identifiers.  I discussed the limitations of evaluation and management by telemedicine and the availability of in person appointments. The patient expressed understanding and agreed to proceed.    Location: Patient: Patient Home Provider: Home Office   GROUP GOAL: Client will attend IOP Aftercare Group Therapy 2-4x a month to connect with peers and to apply strategies discussed to improve mental health condition.  History of Present Illness: GAD  Observations/Objective: Counselor met with Patient in the context of Group Therapy, via Webex. Counselor prompted Patient to share updates on coping skill application and individual therapeutic process in management of mental health. Client noted that she is making progress on personal goals and was able to close on her new home. Client is taking time to reach out to others to communicate needs and to offer emotional connection. Counselor prompted Patient to share areas of concern, challenges and identify barriers to meeting current goals. Client working to prevent becoming overwhelmed and has mixed emotions or lack of ability to process emotions due to timelines on housing transition. Topics covered: coping skills, self-worth, self-doubt, co-morbidities and grief and loss. Patient engaged in discussion, provided feedback for others within the group and took note of additional strategies reviewed and discussed.   Assessment and Plan: Counselor recommends client continue following treatment plan goals, following crisis plan and following up with behavioral health and medical providers as needed. Patient is welcome to join group at next session.   Follow Up Instructions: Counselor to provide link for next session via Webex platform. The patient was  advised to call back or seek an in-person evaluation if the symptoms worsen or if the condition fails to improve as anticipated.  I provided 75 minutes of non-face-to-face time during this encounter.   Lise Auer, LCSW

## 2021-01-17 ENCOUNTER — Encounter (HOSPITAL_COMMUNITY): Payer: Self-pay | Admitting: Psychiatry

## 2021-01-17 ENCOUNTER — Ambulatory Visit (INDEPENDENT_AMBULATORY_CARE_PROVIDER_SITE_OTHER): Payer: 59 | Admitting: Psychiatry

## 2021-01-17 ENCOUNTER — Other Ambulatory Visit: Payer: Self-pay

## 2021-01-17 DIAGNOSIS — F411 Generalized anxiety disorder: Secondary | ICD-10-CM | POA: Diagnosis not present

## 2021-01-17 NOTE — Progress Notes (Signed)
Virtual Visit via Video Note  I connected with Loretta Wu on 01/17/21 at 12:00 PM EST by a video enabled telemedicine application and verified that I am speaking with the correct person using two identifiers.  I discussed the limitations of evaluation and management by telemedicine and the availability of in person appointments. The patient expressed understanding and agreed to proceed.    Location: Patient: Patient Home Provider: Home Office   GROUP GOAL: Client will attend IOP Aftercare Group Therapy 2-4x a month to connect with peers and to apply strategies discussed to improve mental health condition.  History of Present Illness: GAD  Observations/Objective: Counselor met with Patient in the context of Group Therapy, via Webex. Counselor prompted Patient to share updates on coping skill application and individual therapeutic process in management of mental health. Client reports that she is 90% moved into her new house. Client stated that she is starting to see her hard work come to be resolved. Client enjoying new space and set rules to keep it neat and tidy. Counselor prompted Patient to share areas of concern, challenges and identify barriers to meeting current goals. Client concerned about housemate and his need for treatment related to hoarding. Client notes that his hoarding issues cause her anxiety to increase Topics covered: celebrating sobriety, active distraction coping skills, boundary setting, root causes of negative thoughts, thought stopping, and signs of stabilization. Patient engaged in discussion, provided feedback for others within the group and took note of additional strategies reviewed and discussed.   Assessment and Plan: Counselor recommends client continue following treatment plan goals, following crisis plan and following up with behavioral health and medical providers as needed. Patient is welcome to join group at next session.   Follow Up  Instructions: Counselor to provide link for next session via Webex platform. The patient was advised to call back or seek an in-person evaluation if the symptoms worsen or if the condition fails to improve as anticipated.  I provided 75 minutes of non-face-to-face time during this encounter.   Lise Auer, LCSW

## 2021-01-24 ENCOUNTER — Other Ambulatory Visit: Payer: Self-pay

## 2021-01-24 ENCOUNTER — Encounter (HOSPITAL_COMMUNITY): Payer: Self-pay | Admitting: Psychiatry

## 2021-01-24 ENCOUNTER — Ambulatory Visit (INDEPENDENT_AMBULATORY_CARE_PROVIDER_SITE_OTHER): Payer: 59 | Admitting: Psychiatry

## 2021-01-24 DIAGNOSIS — F4310 Post-traumatic stress disorder, unspecified: Secondary | ICD-10-CM

## 2021-01-24 DIAGNOSIS — F411 Generalized anxiety disorder: Secondary | ICD-10-CM

## 2021-01-24 NOTE — Progress Notes (Signed)
Virtual Visit via Video Note  I connected with Loretta Wu on 01/24/21 at 12:00 PM EST by a video enabled telemedicine application and verified that I am speaking with the correct person using two identifiers.  I discussed the limitations of evaluation and management by telemedicine and the availability of in person appointments. The patient expressed understanding and agreed to proceed.    Location: Patient: Patient Home Provider: Home Office   GROUP GOAL: Client will attend IOP Aftercare Group Therapy 2-4x a month to connect with peers and to apply strategies discussed to improve mental health condition.  History of Present Illness: MDD and GAD  Observations/Objective: Counselor met with Patient in the context of Group Therapy, via Webex. Counselor prompted Patient to share updates on coping skill application and individual therapeutic process in management of mental health. Client reports positive progress towards final moving agenda items with the help of friends. Client shared about releasing thoughts and feelings of anger in connection with her roommates lack of care in the moving process. Client noted that it felt good to express self. Counselor prompted Patient to share areas of concern, challenges and identify barriers to meeting current goals. Client discussed disappointment and hurt related to damage discovered in her home due to roommates hoarding issues. Topics covered: socialization challenges during pandemic, recreational and healthy activities, mood trackers, and self-compassion. Patient engaged in discussion, provided feedback for others within the group and took note of additional strategies reviewed and discussed.   Assessment and Plan: Counselor recommends client continue following treatment plan goals, following crisis plan and following up with behavioral health and medical providers as needed. Patient is welcome to join group at next session.   Follow Up  Instructions: Counselor to provide link for next session via Webex platform. The patient was advised to call back or seek an in-person evaluation if the symptoms worsen or if the condition fails to improve as anticipated.  I provided 75 minutes of non-face-to-face time during this encounter.   Lise Auer, LCSW

## 2021-01-31 ENCOUNTER — Ambulatory Visit (INDEPENDENT_AMBULATORY_CARE_PROVIDER_SITE_OTHER): Payer: 59 | Admitting: Psychiatry

## 2021-01-31 ENCOUNTER — Encounter (HOSPITAL_COMMUNITY): Payer: Self-pay | Admitting: Psychiatry

## 2021-01-31 ENCOUNTER — Other Ambulatory Visit: Payer: Self-pay

## 2021-01-31 DIAGNOSIS — F411 Generalized anxiety disorder: Secondary | ICD-10-CM

## 2021-01-31 NOTE — Progress Notes (Signed)
Virtual Visit via Video Note  I connected with Loretta Wu on 01/31/21 at 12:00 PM EST by a video enabled telemedicine application and verified that I am speaking with the correct person using two identifiers.  I discussed the limitations of evaluation and management by telemedicine and the availability of in person appointments. The patient expressed understanding and agreed to proceed.    Location: Patient: Patient Home Provider: Home Office   GROUP GOAL: Client will attend IOP Aftercare Group Therapy 2-4x a month to connect with peers and to apply strategies discussed to improve mental health condition.  History of Present Illness: GAD  Observations/Objective: Counselor met with Patient in the context of Group Therapy, via Webex. Counselor prompted Patient to share updates on coping skill application and individual therapeutic process in management of mental health. Client reports completing major goal of putting her home on the market. Client proud of herself for the work she has accomplished. Counselor prompted Patient to share areas of concern, challenges and identify barriers to meeting current goals. Client shared about anger and frustrations felt connected with her house mate, who damaged and neglected the care of his space in the house, causing the home to value for less than she anticipated. Client verbalized thoughts and feelings to him in a direct and confident manner.  Topics covered: managing anxiety, socialization, setting boundaries, communicating needs, and life transitions. Patient engaged in discussion, provided feedback for others within the group and took note of additional strategies reviewed and discussed.   Assessment and Plan: Counselor recommends client continue following treatment plan goals, following crisis plan and following up with behavioral health and medical providers as needed. Patient is welcome to join group at next session.   Follow Up  Instructions: Counselor to provide link for next session via Webex platform. The patient was advised to call back or seek an in-person evaluation if the symptoms worsen or if the condition fails to improve as anticipated.  I provided 75 minutes of non-face-to-face time during this encounter.   Lise Auer, LCSW

## 2021-02-07 ENCOUNTER — Other Ambulatory Visit: Payer: Self-pay

## 2021-02-07 ENCOUNTER — Encounter (HOSPITAL_COMMUNITY): Payer: Self-pay | Admitting: Psychiatry

## 2021-02-07 ENCOUNTER — Ambulatory Visit (INDEPENDENT_AMBULATORY_CARE_PROVIDER_SITE_OTHER): Payer: 59 | Admitting: Psychiatry

## 2021-02-07 DIAGNOSIS — F331 Major depressive disorder, recurrent, moderate: Secondary | ICD-10-CM | POA: Diagnosis not present

## 2021-02-07 NOTE — Progress Notes (Signed)
Virtual Visit via Video Note  I connected with Loretta Wu on 02/07/21 at 12:00 PM EST by a video enabled telemedicine application and verified that I am speaking with the correct person using two identifiers.  I discussed the limitations of evaluation and management by telemedicine and the availability of in person appointments. The patient expressed understanding and agreed to proceed.    Location: Patient: Patient Home Provider: Home Office   GROUP GOAL: Client will attend IOP Aftercare Group Therapy 2-4x a month to connect with peers and to apply strategies discussed to improve mental health condition.  History of Present Illness: MDD  Observations/Objective: Counselor met with Patient in the context of Group Therapy, via Webex. Counselor prompted Patient to share updates on coping skill application and individual therapeutic process in management of mental health. Client recently sold her home and completed the move in process to her new home. Client would like to identify ways to spend her time to prevent depressive episode. Group members shared suggestions and resources. Counselor prompted Patient to share areas of concern, challenges and identify barriers to meeting current goals. Client discussed frustration and mixed emotions towards her housemates, who caused significant losses in moving process. Client determining how to set and enforce boundaries with him moving forward. Topics covered: psychosis, self-harm, negative cognitions, . Patient engaged in discussion, provided feedback for others within the group and took note of additional strategies reviewed and discussed.   Assessment and Plan: Counselor recommends client continue following treatment plan goals, following crisis plan and following up with behavioral health and medical providers as needed. Patient is welcome to join group at next session.   Follow Up Instructions: Counselor to provide link for next session via  Webex platform. The patient was advised to call back or seek an in-person evaluation if the symptoms worsen or if the condition fails to improve as anticipated.  I provided 75 minutes of non-face-to-face time during this encounter.   Lise Auer, LCSW

## 2021-02-14 ENCOUNTER — Ambulatory Visit (INDEPENDENT_AMBULATORY_CARE_PROVIDER_SITE_OTHER): Payer: BC Managed Care – PPO | Admitting: Psychiatry

## 2021-02-14 ENCOUNTER — Other Ambulatory Visit: Payer: Self-pay

## 2021-02-14 ENCOUNTER — Encounter (HOSPITAL_COMMUNITY): Payer: Self-pay | Admitting: Psychiatry

## 2021-02-14 DIAGNOSIS — F331 Major depressive disorder, recurrent, moderate: Secondary | ICD-10-CM

## 2021-02-14 NOTE — Progress Notes (Signed)
Virtual Visit via Video Note  I connected with Loretta Wu on 02/14/21 at 12:00 PM EST by a video enabled telemedicine application and verified that I am speaking with the correct person using two identifiers.  I discussed the limitations of evaluation and management by telemedicine and the availability of in person appointments. The patient expressed understanding and agreed to proceed.    Location: Patient: Patient Home Provider: Home Office   GROUP GOAL: Client will attend IOP Aftercare Group Therapy 2-4x a month to connect with peers and to apply strategies discussed to improve mental health condition.  History of Present Illness: MDD  Observations/Objective: Counselor met with Patient in the context of Group Therapy, via Webex. Counselor prompted Patient to share updates on coping skill application and individual therapeutic process in management of mental health. Client discussed the desire to be happier and content in life. Client finding it challenging to communicate internal through and emotional processes with friends and support system. Client is considering making new friends. Counselor prompted Patient to share areas of concern, challenges and identify barriers to meeting current goals. Client reports feeling lonely, isolated and grief related to several losses and changes over the past 6 months. Client seeking to find productive and meaningful ways to spend her time. Topics covered: utilizing support system, loneliness, understanding mental health and how to advocate for needs. Patient engaged in discussion, provided feedback for others within the group and took note of additional strategies reviewed and discussed.   Assessment and Plan: Counselor recommends client continue following treatment plan goals, following crisis plan and following up with behavioral health and medical providers as needed. Patient is welcome to join group at next session.   Follow Up  Instructions: Counselor to provide link for next session via Webex platform. The patient was advised to call back or seek an in-person evaluation if the symptoms worsen or if the condition fails to improve as anticipated.  I provided 90 minutes of non-face-to-face time during this encounter.   Lise Auer, LCSW

## 2021-02-21 ENCOUNTER — Ambulatory Visit (INDEPENDENT_AMBULATORY_CARE_PROVIDER_SITE_OTHER): Payer: 59 | Admitting: Psychiatry

## 2021-02-21 ENCOUNTER — Other Ambulatory Visit: Payer: Self-pay

## 2021-02-21 ENCOUNTER — Encounter (HOSPITAL_COMMUNITY): Payer: Self-pay | Admitting: Psychiatry

## 2021-02-21 DIAGNOSIS — F331 Major depressive disorder, recurrent, moderate: Secondary | ICD-10-CM | POA: Diagnosis not present

## 2021-02-21 NOTE — Progress Notes (Signed)
Virtual Visit via Video Note  I connected with Loretta Wu on 02/21/21 at 12:00 PM EST by a video enabled telemedicine application and verified that I am speaking with the correct person using two identifiers.  I discussed the limitations of evaluation and management by telemedicine and the availability of in person appointments. The patient expressed understanding and agreed to proceed.    Location: Patient: Patient Home Provider: Home Office   GROUP GOAL: Client will attend IOP Aftercare Group Therapy 2-4x a month to connect with peers and to apply strategies discussed to improve mental health condition.  History of Present Illness: MDD  Observations/Objective: Counselor met with Patient in the context of Group Therapy, via Webex. Counselor prompted Patient to share updates on coping skill application and individual therapeutic process in management of mental health. Client reports disappointing doctor's appointment with provider that was not hopeful for management of depression. Client requested more information about [place], ECT and other modalities. Counselor prompted Patient to share areas of concern, challenges and identify barriers to meeting current goals. Counselor shared information about specific topics and processed feelings of sadness, lack of purpose and loneliness. Topics covered: alternative treatments, medications, side effects, depression, making important decisions and recreational activities. Patient engaged in discussion, provided feedback for others within the group and took note of additional strategies reviewed and discussed.   Assessment and Plan: Counselor recommends client continue following treatment plan goals, following crisis plan and following up with behavioral health and medical providers as needed. Patient is welcome to join group at next session.   Follow Up Instructions: Counselor to provide link for next session via Webex platform. The patient was  advised to call back or seek an in-person evaluation if the symptoms worsen or if the condition fails to improve as anticipated.  I provided 90 minutes of non-face-to-face time during this encounter.   Lise Auer, LCSW

## 2021-02-28 ENCOUNTER — Encounter (HOSPITAL_COMMUNITY): Payer: Self-pay | Admitting: Psychiatry

## 2021-02-28 ENCOUNTER — Other Ambulatory Visit: Payer: Self-pay

## 2021-02-28 ENCOUNTER — Ambulatory Visit (INDEPENDENT_AMBULATORY_CARE_PROVIDER_SITE_OTHER): Payer: 59 | Admitting: Psychiatry

## 2021-02-28 DIAGNOSIS — F411 Generalized anxiety disorder: Secondary | ICD-10-CM

## 2021-02-28 NOTE — Progress Notes (Signed)
Virtual Visit via Video Note  I connected with Loretta Wu on 02/28/21 at 12:00 PM EST by a video enabled telemedicine application and verified that I am speaking with the correct person using two identifiers.  I discussed the limitations of evaluation and management by telemedicine and the availability of in person appointments. The patient expressed understanding and agreed to proceed.    Location: Patient: Patient Home Provider: Home Office   GROUP GOAL: Client will attend IOP Aftercare Group Therapy 2-4x a month to connect with peers and to apply strategies discussed to improve mental health condition.  History of Present Illness: MDD  Observations/Objective: Counselor met with Patient in the context of Group Therapy, via Webex. Counselor prompted Patient to share updates on coping skill application and individual therapeutic process in management of mental health. Counselor prompted Patient to share areas of concern, challenges and identify barriers to meeting current goals. Topics covered: grief and loss, future plans, depressive symptoms, weekly structure, and distraction as a coping strategy. Patient engaged in discussion, provided feedback for others within the group and took note of additional strategies reviewed and discussed.   Assessment and Plan: Counselor recommends client continue following treatment plan goals, following crisis plan and following up with behavioral health and medical providers as needed. Patient is welcome to join group at next session.   Follow Up Instructions: Counselor to provide link for next session via Webex platform. The patient was advised to call back or seek an in-person evaluation if the symptoms worsen or if the condition fails to improve as anticipated.  I provided 75 minutes of non-face-to-face time during this encounter.   Lise Auer, LCSW

## 2021-03-07 ENCOUNTER — Other Ambulatory Visit: Payer: Self-pay

## 2021-03-07 ENCOUNTER — Encounter (HOSPITAL_COMMUNITY): Payer: Self-pay | Admitting: Psychiatry

## 2021-03-07 ENCOUNTER — Ambulatory Visit (INDEPENDENT_AMBULATORY_CARE_PROVIDER_SITE_OTHER): Payer: Managed Care, Other (non HMO) | Admitting: Psychiatry

## 2021-03-07 DIAGNOSIS — F331 Major depressive disorder, recurrent, moderate: Secondary | ICD-10-CM | POA: Diagnosis not present

## 2021-03-07 DIAGNOSIS — R69 Illness, unspecified: Secondary | ICD-10-CM | POA: Diagnosis not present

## 2021-03-07 NOTE — Progress Notes (Signed)
Virtual Visit via Video Note  I connected with Matsue Strom on 03/07/21 at 12:00 PM EST by a video enabled telemedicine application and verified that I am speaking with the correct person using two identifiers.  I discussed the limitations of evaluation and management by telemedicine and the availability of in person appointments. The patient expressed understanding and agreed to proceed.    Location: Patient: Patient Home Provider: Home Office   GROUP GOAL: Client will attend IOP Aftercare Group Therapy 2-4x a month to connect with peers and to apply strategies discussed to improve mental health condition.  History of Present Illness: MDD  Observations/Objective: Counselor met with Patient in the context of Group Therapy, via Webex. Counselor prompted Patient to share updates on coping skill application and individual therapeutic process in management of mental health. Client reports making plans with friends and enjoying time with them. Counselor prompted Patient to share areas of concern, challenges and identify barriers to meeting current goals. Client looking into places to spend time during day to combat loneliness. Client connecting with friends on online to game and socialize.Topics covered: childhood traumas, intimacy and sex drive issues, depressive thoughts, coping with chronic mental illness, and combating lonliness. Patient engaged in discussion, provided feedback for others within the group and took note of additional strategies reviewed and discussed.   Assessment and Plan: Counselor recommends client continue following treatment plan goals, following crisis plan and following up with behavioral health and medical providers as needed. Patient is welcome to join group at next session.   Follow Up Instructions: Counselor to provide link for next session via Webex platform. The patient was advised to call back or seek an in-person evaluation if the symptoms worsen or if the  condition fails to improve as anticipated.  I provided 90 minutes of non-face-to-face time during this encounter.   Lise Auer, LCSW

## 2021-03-14 ENCOUNTER — Ambulatory Visit (INDEPENDENT_AMBULATORY_CARE_PROVIDER_SITE_OTHER): Payer: Managed Care, Other (non HMO) | Admitting: Psychiatry

## 2021-03-14 ENCOUNTER — Other Ambulatory Visit: Payer: Self-pay

## 2021-03-14 DIAGNOSIS — F331 Major depressive disorder, recurrent, moderate: Secondary | ICD-10-CM

## 2021-03-14 DIAGNOSIS — R69 Illness, unspecified: Secondary | ICD-10-CM | POA: Diagnosis not present

## 2021-03-15 ENCOUNTER — Encounter (HOSPITAL_COMMUNITY): Payer: Self-pay | Admitting: Psychiatry

## 2021-03-15 NOTE — Progress Notes (Signed)
Virtual Visit via Video Note  I connected with Loretta Wu on 03/14/21 at 12:00 PM EDT by a video enabled telemedicine application and verified that I am speaking with the correct person using two identifiers.  I discussed the limitations of evaluation and management by telemedicine and the availability of in person appointments. The patient expressed understanding and agreed to proceed.    Location: Patient: Patient Home Provider: Home Office   GROUP GOAL: Client will attend IOP Aftercare Group Therapy 2-4x a month to connect with peers and to apply strategies discussed to improve mental health condition.  History of Present Illness: MDD  Observations/Objective: Counselor met with Patient in the context of Group Therapy, via Webex. Counselor prompted Patient to share updates on coping skill application and individual therapeutic process in management of mental health. Counselor prompted Patient to share areas of concern, challenges and identify barriers to meeting current goals. Topics covered: communicating effectively with psychiatrists, medication compliance, communication with support system, low energy, subconscious trauma/grief triggers, and specialized treatment for OCD. Patient engaged in discussion, provided feedback for others within the group and took note of additional strategies reviewed and discussed.   Assessment and Plan: Counselor recommends client continue following treatment plan goals, following crisis plan and following up with behavioral health and medical providers as needed. Patient is welcome to join group at next session.   Follow Up Instructions: Counselor to provide link for next session via Webex platform. The patient was advised to call back or seek an in-person evaluation if the symptoms worsen or if the condition fails to improve as anticipated.  I provided 70 minutes of non-face-to-face time during this encounter.   Bethany Morris, LCSW 

## 2021-03-17 DIAGNOSIS — R69 Illness, unspecified: Secondary | ICD-10-CM | POA: Diagnosis not present

## 2021-03-21 ENCOUNTER — Encounter (HOSPITAL_COMMUNITY): Payer: Self-pay | Admitting: Psychiatry

## 2021-03-21 ENCOUNTER — Ambulatory Visit (INDEPENDENT_AMBULATORY_CARE_PROVIDER_SITE_OTHER): Payer: Managed Care, Other (non HMO) | Admitting: Psychiatry

## 2021-03-21 ENCOUNTER — Other Ambulatory Visit: Payer: Self-pay

## 2021-03-21 DIAGNOSIS — F331 Major depressive disorder, recurrent, moderate: Secondary | ICD-10-CM | POA: Diagnosis not present

## 2021-03-21 DIAGNOSIS — R69 Illness, unspecified: Secondary | ICD-10-CM | POA: Diagnosis not present

## 2021-03-21 NOTE — Progress Notes (Signed)
Virtual Visit via Video Note  I connected with Loretta Wu on 03/21/21 at 12:00 PM EDT by a video enabled telemedicine application and verified that I am speaking with the correct person using two identifiers.  I discussed the limitations of evaluation and management by telemedicine and the availability of in person appointments. The patient expressed understanding and agreed to proceed.    Location: Patient: Patient Home Provider: Home Office   GROUP GOAL: Client will attend IOP Aftercare Group Therapy 2-4x a month to connect with peers and to apply strategies discussed to improve mental health condition.  History of Present Illness: MDD  Observations/Objective: Counselor met with Patient in the context of Group Therapy, via Webex. Counselor prompted Patient to share updates on coping skill application and individual therapeutic process in management of mental health. Client reports intentional efforts to maintain mental health.  Counselor prompted Patient to share areas of concern, challenges and identify barriers to meeting current goals. Topics covered: medication compliance, provider-client relationship building, communication skills, addressing verbal/emotional abuse, and enhancing self-esteem. Patient engaged in discussion, provided feedback for others within the group and took note of additional strategies reviewed and discussed.   Assessment and Plan: Counselor recommends client continue following treatment plan goals, following crisis plan and following up with behavioral health and medical providers as needed. Patient is welcome to join group at next session.   Follow Up Instructions: Counselor to provide link for next session via Webex platform. The patient was advised to call back or seek an in-person evaluation if the symptoms worsen or if the condition fails to improve as anticipated.  I provided 70 minutes of non-face-to-face time during this encounter.   Lise Auer, LCSW

## 2021-03-28 ENCOUNTER — Ambulatory Visit (INDEPENDENT_AMBULATORY_CARE_PROVIDER_SITE_OTHER): Payer: 59 | Admitting: Psychiatry

## 2021-03-28 ENCOUNTER — Other Ambulatory Visit: Payer: Self-pay

## 2021-03-28 DIAGNOSIS — F331 Major depressive disorder, recurrent, moderate: Secondary | ICD-10-CM | POA: Diagnosis not present

## 2021-03-28 DIAGNOSIS — R69 Illness, unspecified: Secondary | ICD-10-CM | POA: Diagnosis not present

## 2021-03-28 NOTE — Progress Notes (Signed)
Virtual Visit via Video Note  I connected with Loretta Wu on 03/28/21 at 12:00 PM EDT by a video enabled telemedicine application and verified that I am speaking with the correct person using two identifiers.  I discussed the limitations of evaluation and management by telemedicine and the availability of in person appointments. The patient expressed understanding and agreed to proceed.    Location: Patient: Patient Home Provider: Home Office   GROUP GOAL: Client will attend IOP Aftercare Group Therapy 2-4x a month to connect with peers and to apply strategies discussed to improve mental health condition.  History of Present Illness: MDD  Observations/Objective: Counselor met with Patient in the context of Group Therapy, via Webex. Counselor prompted Patient to share updates on coping skill application and individual therapeutic process in management of mental health. Client reports intentional efforts to maintain mental health.  Counselor prompted Patient to share areas of concern, challenges and identify barriers to meeting current goals. Topics covered: life transitions, getting back engaged in personal interests and activities, safety plans and exit plans for challenging situatitons. Patient engaged in discussion, provided feedback for others within the group and took note of additional strategies reviewed and discussed.   Assessment and Plan: Counselor recommends client continue following treatment plan goals, following crisis plan and following up with behavioral health and medical providers as needed. Patient is welcome to join group at next session.   Follow Up Instructions: Counselor to provide link for next session via Webex platform. The patient was advised to call back or seek an in-person evaluation if the symptoms worsen or if the condition fails to improve as anticipated.  I provided 70 minutes of non-face-to-face time during this encounter.   Lise Auer, LCSW

## 2021-03-29 ENCOUNTER — Encounter (HOSPITAL_COMMUNITY): Payer: Self-pay | Admitting: Psychiatry

## 2021-03-31 DIAGNOSIS — I10 Essential (primary) hypertension: Secondary | ICD-10-CM | POA: Diagnosis not present

## 2021-04-04 ENCOUNTER — Other Ambulatory Visit: Payer: Self-pay

## 2021-04-04 ENCOUNTER — Encounter (HOSPITAL_COMMUNITY): Payer: Self-pay | Admitting: Psychiatry

## 2021-04-04 ENCOUNTER — Ambulatory Visit (HOSPITAL_COMMUNITY): Payer: 59 | Admitting: Psychiatry

## 2021-04-04 DIAGNOSIS — F411 Generalized anxiety disorder: Secondary | ICD-10-CM

## 2021-04-04 DIAGNOSIS — F331 Major depressive disorder, recurrent, moderate: Secondary | ICD-10-CM

## 2021-04-04 NOTE — Progress Notes (Signed)
Virtual Visit via Video Note  I connected with Loretta Wu on 04/04/21 at 12:00 PM EDT by a video enabled telemedicine application and verified that I am speaking with the correct person using two identifiers.  I discussed the limitations of evaluation and management by telemedicine and the availability of in person appointments. The patient expressed understanding and agreed to proceed.    Location: Patient: Patient Home Provider: Home Office   GROUP GOAL: Client will attend IOP Aftercare Group Therapy 2-4x a month to connect with peers and to apply strategies discussed to improve mental health condition.  History of Present Illness: MDD and GAD  Observations/Objective: Counselor met with Patient in the context of Group Therapy, via Webex. Counselor prompted Patient to share updates on coping skill application and individual therapeutic process in management of mental health. Client reports intentional efforts to maintain mental health.  Counselor prompted Patient to share areas of concern, challenges and identify barriers to meeting current goals. Topics covered: survivor anniversaries, sobriety, assertive communication, boundaries, medication management, coping skill application, PTSD symptoms, and stress management. Patient engaged in discussion, provided feedback for others within the group and took note of additional strategies reviewed and discussed.   Assessment and Plan: Counselor recommends client continue following treatment plan goals, following crisis plan and following up with behavioral health and medical providers as needed. Patient is welcome to join group at next session.   Follow Up Instructions: Counselor to provide link for next session via Webex platform. The patient was advised to call back or seek an in-person evaluation if the symptoms worsen or if the condition fails to improve as anticipated.  I provided 80 minutes of non-face-to-face time during this  encounter.   Lise Auer, LCSW

## 2021-04-11 ENCOUNTER — Other Ambulatory Visit: Payer: Self-pay

## 2021-04-11 ENCOUNTER — Encounter (HOSPITAL_COMMUNITY): Payer: Self-pay | Admitting: Psychiatry

## 2021-04-11 ENCOUNTER — Ambulatory Visit (INDEPENDENT_AMBULATORY_CARE_PROVIDER_SITE_OTHER): Payer: 59 | Admitting: Psychiatry

## 2021-04-11 DIAGNOSIS — R69 Illness, unspecified: Secondary | ICD-10-CM | POA: Diagnosis not present

## 2021-04-11 DIAGNOSIS — F411 Generalized anxiety disorder: Secondary | ICD-10-CM

## 2021-04-11 DIAGNOSIS — F331 Major depressive disorder, recurrent, moderate: Secondary | ICD-10-CM | POA: Diagnosis not present

## 2021-04-11 NOTE — Progress Notes (Signed)
Virtual Visit via Video Note  I connected with Loretta Wu on 04/11/21 at 12:00 PM EDT by a video enabled telemedicine application and verified that I am speaking with the correct person using two identifiers.  I discussed the limitations of evaluation and management by telemedicine and the availability of in person appointments. The patient expressed understanding and agreed to proceed.    Location: Patient: Patient Home Provider: Home Office   GROUP GOAL: Client will attend IOP Aftercare Group Therapy 2-4x a month to connect with peers and to apply strategies discussed to improve mental health condition.  History of Present Illness: MDD and GAD  Observations/Objective: Counselor met with Patient in the context of Group Therapy, via Webex. Counselor prompted Patient to share updates on coping skill application and individual therapeutic process in management of mental health. Client reports intentional efforts to maintain mental health.  Counselor prompted Patient to share areas of concern, challenges and identify barriers to meeting current goals. Topics covered: preparing for stressful events, challenging self to change habits, future planning, communicating needs, and medication compliance. Patient engaged in discussion, provided feedback for others within the group and took note of additional strategies reviewed and discussed.   Assessment and Plan: Counselor recommends client continue following treatment plan goals, following crisis plan and following up with behavioral health and medical providers as needed. Patient is welcome to join group at next session.   Follow Up Instructions: Counselor to provide link for next session via Webex platform. The patient was advised to call back or seek an in-person evaluation if the symptoms worsen or if the condition fails to improve as anticipated.  I provided 80 minutes of non-face-to-face time during this encounter.   Lise Auer,  LCSW

## 2021-04-25 ENCOUNTER — Other Ambulatory Visit: Payer: Self-pay

## 2021-04-25 ENCOUNTER — Ambulatory Visit (INDEPENDENT_AMBULATORY_CARE_PROVIDER_SITE_OTHER): Payer: 59 | Admitting: Psychiatry

## 2021-04-25 DIAGNOSIS — F331 Major depressive disorder, recurrent, moderate: Secondary | ICD-10-CM

## 2021-04-25 DIAGNOSIS — F411 Generalized anxiety disorder: Secondary | ICD-10-CM | POA: Diagnosis not present

## 2021-04-25 DIAGNOSIS — R69 Illness, unspecified: Secondary | ICD-10-CM | POA: Diagnosis not present

## 2021-04-26 ENCOUNTER — Encounter (HOSPITAL_COMMUNITY): Payer: Self-pay | Admitting: Psychiatry

## 2021-04-26 NOTE — Progress Notes (Signed)
Virtual Visit via Video Note  I connected with Chea Malan on 04/26/21 at 12:00 PM EDT by a video enabled telemedicine application and verified that I am speaking with the correct person using two identifiers.  I discussed the limitations of evaluation and management by telemedicine and the availability of in person appointments. The patient expressed understanding and agreed to proceed.    Location: Patient: Patient Home Provider: Home Office   GROUP GOAL: Client will attend IOP Aftercare Group Therapy 2-4x a month to connect with peers and to apply strategies discussed to improve mental health condition.  History of Present Illness: MDD and GAD  Observations/Objective: Counselor met with Patient in the context of Group Therapy, via Webex. Counselor prompted Patient to share updates on coping skill application and individual therapeutic process in management of mental health. Client reports intentional efforts to maintain mental health.  Counselor prompted Patient to share areas of concern, challenges and identify barriers to meeting current goals. Topics covered: potential treatments and engagement, respite, "adulting", stress management, employment issues, improvement in relationships through behavior change, and managing mental health through litigation trials. Patient engaged in discussion, provided feedback for others within the group and took note of additional strategies reviewed and discussed.   Assessment and Plan: Counselor recommends client continue following treatment plan goals, following crisis plan and following up with behavioral health and medical providers as needed. Patient is welcome to join group at next session.   Follow Up Instructions: Counselor to provide link for next session via Webex platform. The patient was advised to call back or seek an in-person evaluation if the symptoms worsen or if the condition fails to improve as anticipated.  I provided 70  minutes of non-face-to-face time during this encounter.   Lise Auer, LCSW

## 2021-05-02 ENCOUNTER — Ambulatory Visit (INDEPENDENT_AMBULATORY_CARE_PROVIDER_SITE_OTHER): Payer: 59 | Admitting: Psychiatry

## 2021-05-02 ENCOUNTER — Encounter (HOSPITAL_COMMUNITY): Payer: Self-pay | Admitting: Psychiatry

## 2021-05-02 ENCOUNTER — Other Ambulatory Visit: Payer: Self-pay

## 2021-05-02 DIAGNOSIS — F331 Major depressive disorder, recurrent, moderate: Secondary | ICD-10-CM | POA: Diagnosis not present

## 2021-05-02 DIAGNOSIS — R69 Illness, unspecified: Secondary | ICD-10-CM | POA: Diagnosis not present

## 2021-05-02 NOTE — Progress Notes (Signed)
Virtual Visit via Video Note  I connected with Tenecia Ignasiak on 05/02/21 at 12:00 PM EDT by a video enabled telemedicine application and verified that I am speaking with the correct person using two identifiers.  I discussed the limitations of evaluation and management by telemedicine and the availability of in person appointments. The patient expressed understanding and agreed to proceed.    Location: Patient: Patient Home Provider: Home Office   GROUP GOAL: Client will attend IOP Aftercare Group Therapy 2-4x a month to connect with peers and to apply strategies discussed to improve mental health condition.  History of Present Illness: MDD and GAD  Observations/Objective: Counselor met with Patient in the context of Group Therapy, via Webex. Counselor prompted Patient to share updates on coping skill application and individual therapeutic process in management of mental health. Client reports intentional efforts to maintain mental health.  Counselor prompted Patient to share areas of concern, challenges and identify barriers to meeting current goals. Topics covered: avoidance, cognitive distortions, anxiety coping strategies, work related stressors, mental health triggers by family, and how physical health impact medical health. Patient engaged in discussion, provided feedback for others within the group and took note of additional strategies reviewed and discussed.   Assessment and Plan: Counselor recommends client continue following treatment plan goals, following crisis plan and following up with behavioral health and medical providers as needed. Patient is welcome to join group at next session.   Follow Up Instructions: Counselor to provide link for next session via Webex platform. The patient was advised to call back or seek an in-person evaluation if the symptoms worsen or if the condition fails to improve as anticipated.  I provided 90 minutes of non-face-to-face time during  this encounter.   Lise Auer, LCSW

## 2021-05-09 ENCOUNTER — Encounter (HOSPITAL_COMMUNITY): Payer: Self-pay | Admitting: Psychiatry

## 2021-05-09 ENCOUNTER — Ambulatory Visit (INDEPENDENT_AMBULATORY_CARE_PROVIDER_SITE_OTHER): Payer: 59 | Admitting: Psychiatry

## 2021-05-09 ENCOUNTER — Other Ambulatory Visit: Payer: Self-pay

## 2021-05-09 DIAGNOSIS — F331 Major depressive disorder, recurrent, moderate: Secondary | ICD-10-CM | POA: Diagnosis not present

## 2021-05-09 DIAGNOSIS — F411 Generalized anxiety disorder: Secondary | ICD-10-CM | POA: Diagnosis not present

## 2021-05-09 DIAGNOSIS — R69 Illness, unspecified: Secondary | ICD-10-CM | POA: Diagnosis not present

## 2021-05-09 NOTE — Progress Notes (Signed)
Virtual Visit via Video Note  I connected with Loretta Wu on 05/09/21 at 12:00 PM EDT by a video enabled telemedicine application and verified that I am speaking with the correct person using two identifiers.  I discussed the limitations of evaluation and management by telemedicine and the availability of in person appointments. The patient expressed understanding and agreed to proceed.    Location: Patient: Patient Home Provider: Home Office   GROUP GOAL: Client will attend IOP Aftercare Group Therapy 2-4x a month to connect with peers and to apply strategies discussed to improve mental health condition.  History of Present Illness: MDD andGAD  Observations/Objective: Counselor met with Patient in the context of Group Therapy, via Webex. Counselor prompted Patient to share updates on coping skill application and individual therapeutic process in management of mental health. Client reports intentional efforts to maintain mental health.  Counselor prompted Patient to share areas of concern, challenges and identify barriers to meeting current goals. Topics covered: procrastination, navigating mixed feelings, updates on Mother's Day celebrations/grief, reestablishing care, family issues, PMDD, and stress of moving. Patient engaged in discussion, provided feedback for others within the group and took note of additional strategies reviewed and discussed.   Assessment and Plan: Counselor recommends client continue following treatment plan goals, following crisis plan and following up with behavioral health and medical providers as needed. Patient is welcome to join group at next session.   Follow Up Instructions: Counselor to provide link for next session via Webex platform. The patient was advised to call back or seek an in-person evaluation if the symptoms worsen or if the condition fails to improve as anticipated.  I provided 90 minutes of non-face-to-face time during this  encounter.   Lise Auer, LCSW

## 2021-05-11 DIAGNOSIS — H04123 Dry eye syndrome of bilateral lacrimal glands: Secondary | ICD-10-CM | POA: Diagnosis not present

## 2021-05-12 DIAGNOSIS — R69 Illness, unspecified: Secondary | ICD-10-CM | POA: Diagnosis not present

## 2021-05-16 ENCOUNTER — Ambulatory Visit (INDEPENDENT_AMBULATORY_CARE_PROVIDER_SITE_OTHER): Payer: 59 | Admitting: Psychiatry

## 2021-05-16 DIAGNOSIS — F331 Major depressive disorder, recurrent, moderate: Secondary | ICD-10-CM | POA: Diagnosis not present

## 2021-05-16 DIAGNOSIS — R69 Illness, unspecified: Secondary | ICD-10-CM | POA: Diagnosis not present

## 2021-05-17 ENCOUNTER — Other Ambulatory Visit: Payer: Self-pay

## 2021-05-17 DIAGNOSIS — H5711 Ocular pain, right eye: Secondary | ICD-10-CM | POA: Diagnosis not present

## 2021-05-19 ENCOUNTER — Encounter (HOSPITAL_COMMUNITY): Payer: Self-pay | Admitting: Psychiatry

## 2021-05-19 NOTE — Progress Notes (Signed)
Virtual Visit via Video Note  I connected with Loretta Wu on 05/19/21 at 12:00 PM EDT by a video enabled telemedicine application and verified that I am speaking with the correct person using two identifiers.  I discussed the limitations of evaluation and management by telemedicine and the availability of in person appointments. The patient expressed understanding and agreed to proceed.    Location: Patient: Patient Home Provider: Home Office   GROUP GOAL: Client will attend IOP Aftercare Group Therapy 2-4x a month to connect with peers and to apply strategies discussed to improve mental health condition.  History of Present Illness: MDD  Observations/Objective: Counselor met with Patient in the context of Group Therapy, via Webex. Counselor prompted Patient to share updates on coping skill application and individual therapeutic process in management of mental health. Client reports intentional efforts to maintain mental health.  Counselor prompted Patient to share areas of concern, challenges and identify barriers to meeting current goals. Topics covered: self-advocacy, medication management, historical racial trauma, mental health and violence, myth of MH and boundaries. Patient engaged in discussion, provided feedback for others within the group and took note of additional strategies reviewed and discussed.   Assessment and Plan: Counselor recommends client continue following treatment plan goals, following crisis plan and following up with behavioral health and medical providers as needed. Patient is welcome to join group at next session.   Follow Up Instructions: Counselor to provide link for next session via Webex platform. The patient was advised to call back or seek an in-person evaluation if the symptoms worsen or if the condition fails to improve as anticipated.  I provided 75 minutes of non-face-to-face time during this encounter.   Lise Auer, LCSW

## 2021-05-23 ENCOUNTER — Ambulatory Visit (INDEPENDENT_AMBULATORY_CARE_PROVIDER_SITE_OTHER): Payer: 59 | Admitting: Psychiatry

## 2021-05-23 ENCOUNTER — Other Ambulatory Visit: Payer: Self-pay

## 2021-05-23 DIAGNOSIS — F331 Major depressive disorder, recurrent, moderate: Secondary | ICD-10-CM | POA: Diagnosis not present

## 2021-05-23 DIAGNOSIS — R69 Illness, unspecified: Secondary | ICD-10-CM | POA: Diagnosis not present

## 2021-05-25 ENCOUNTER — Encounter (HOSPITAL_COMMUNITY): Payer: Self-pay | Admitting: Psychiatry

## 2021-05-25 DIAGNOSIS — M62830 Muscle spasm of back: Secondary | ICD-10-CM | POA: Diagnosis not present

## 2021-05-25 DIAGNOSIS — M5412 Radiculopathy, cervical region: Secondary | ICD-10-CM | POA: Diagnosis not present

## 2021-05-25 NOTE — Progress Notes (Signed)
Virtual Visit via Video Note  I connected with Maleni Seyer on 05/23/21 at 12:00 PM EDT by a video enabled telemedicine application and verified that I am speaking with the correct person using two identifiers.  I discussed the limitations of evaluation and management by telemedicine and the availability of in person appointments. The patient expressed understanding and agreed to proceed.    Location: Patient: Patient Home Provider: Home Office   GROUP GOAL: Client will attend IOP Aftercare Group Therapy 2-4x a month to connect with peers and to apply strategies discussed to improve mental health condition.  History of Present Illness: MDD  Observations/Objective: Counselor met with Patient in the context of Group Therapy, via Webex. Counselor prompted Patient to share updates on coping skill application and individual therapeutic process in management of mental health. Client reports intentional efforts to maintain mental health.  Counselor prompted Patient to share areas of concern, challenges and identify barriers to meeting current goals. Topics covered: holistic approach to care, alternative coping skills, behavior management, group support, labeling hope, transitioning care when provider retires, and depressive symptom management. Patient engaged in discussion, provided feedback for others within the group and took note of additional strategies reviewed and discussed.   Assessment and Plan: Counselor recommends client continue following treatment plan goals, following crisis plan and following up with behavioral health and medical providers as needed. Patient is welcome to join group at next session.   Follow Up Instructions: Counselor to provide link for next session via Webex platform. The patient was advised to call back or seek an in-person evaluation if the symptoms worsen or if the condition fails to improve as anticipated.  I provided 80 minutes of non-face-to-face time  during this encounter.   Lise Auer, LCSW

## 2021-06-27 ENCOUNTER — Other Ambulatory Visit: Payer: Self-pay

## 2021-06-27 ENCOUNTER — Ambulatory Visit (INDEPENDENT_AMBULATORY_CARE_PROVIDER_SITE_OTHER): Payer: 59 | Admitting: Psychiatry

## 2021-06-27 ENCOUNTER — Encounter (HOSPITAL_COMMUNITY): Payer: Self-pay | Admitting: Psychiatry

## 2021-06-27 DIAGNOSIS — F331 Major depressive disorder, recurrent, moderate: Secondary | ICD-10-CM | POA: Diagnosis not present

## 2021-06-27 DIAGNOSIS — R69 Illness, unspecified: Secondary | ICD-10-CM | POA: Diagnosis not present

## 2021-06-27 NOTE — Progress Notes (Signed)
Virtual Visit via Video Note  I connected with Loretta Wu on 06/27/21 at 12:00 PM EDT by a video enabled telemedicine application and verified that I am speaking with the correct person using two identifiers.  I discussed the limitations of evaluation and management by telemedicine and the availability of in person appointments. The patient expressed understanding and agreed to proceed.    Location: Patient: Patient Home Provider: Home Office   GROUP GOAL: Client will attend IOP Aftercare Group Therapy 2-4x a month to connect with peers and to apply strategies discussed to improve mental health condition.  History of Present Illness: MDD  Observations/Objective: Counselor met with Patient in the context of Group Therapy, via Webex. Counselor prompted Patient to share updates on coping skill application and individual therapeutic process in management of mental health. Client reports intentional efforts to maintain mental health.  Counselor prompted Patient to share areas of concern, challenges and identify barriers to meeting current goals. Group topics covered: current events impact on MH, medical issues impacting mental health, substance abstinence maintenance, childhood traumas, increased socialization, intimate partner communication, medication concerns, and grief and loss.  Patient engaged in discussion, provided feedback for others within the group and took note of additional strategies reviewed and discussed.   Assessment and Plan: Counselor recommends client continue following treatment plan goals, following crisis plan and following up with behavioral health and medical providers as needed. Patient is welcome to join group at next session.   Follow Up Instructions: Counselor to provide link for next session via Webex platform. The patient was advised to call back or seek an in-person evaluation if the symptoms worsen or if the condition fails to improve as anticipated.  I  provided 90 minutes of non-face-to-face time during this encounter.   Lise Auer, LCSW

## 2021-07-04 ENCOUNTER — Other Ambulatory Visit: Payer: Self-pay

## 2021-07-04 ENCOUNTER — Ambulatory Visit (INDEPENDENT_AMBULATORY_CARE_PROVIDER_SITE_OTHER): Payer: 59 | Admitting: Psychiatry

## 2021-07-04 ENCOUNTER — Encounter (HOSPITAL_COMMUNITY): Payer: Self-pay | Admitting: Psychiatry

## 2021-07-04 DIAGNOSIS — F411 Generalized anxiety disorder: Secondary | ICD-10-CM | POA: Diagnosis not present

## 2021-07-04 DIAGNOSIS — F331 Major depressive disorder, recurrent, moderate: Secondary | ICD-10-CM | POA: Diagnosis not present

## 2021-07-04 DIAGNOSIS — R69 Illness, unspecified: Secondary | ICD-10-CM | POA: Diagnosis not present

## 2021-07-04 NOTE — Progress Notes (Signed)
Virtual Visit via Video Note  I connected with Loretta Wu on 07/04/21 at 12:00 PM EDT by a video enabled telemedicine application and verified that I am speaking with the correct person using two identifiers.  I discussed the limitations of evaluation and management by telemedicine and the availability of in person appointments. The Loretta Wu expressed understanding and agreed to proceed.    Location: Loretta Wu: Loretta Wu Home Provider: Home Office   GROUP GOAL: Client will attend IOP Aftercare Group Therapy 2-4x a month to connect with peers and to apply strategies discussed to improve mental health condition.  History of Present Illness: MDD  Observations/Objective: Counselor met with Loretta Wu in the context of Group Therapy, via Webex. Counselor prompted Loretta Wu to share updates on coping skill application and individual therapeutic process in management of mental health. Client reports intentional efforts to maintain mental health.  Counselor prompted Loretta Wu to share areas of concern, challenges and identify barriers to meeting current goals. Group topics covered: self-doubt, isolation, trauma triggers, grief responses, weight loss surgery and impact on MH, impact of current events, incorporating exercise, and discharge planning.  Loretta Wu engaged in discussion, provided feedback for others within the group and took note of additional strategies reviewed and discussed.   Assessment and Plan: Counselor recommends client continue following treatment plan goals, following crisis plan and following up with behavioral health and medical providers as needed. Loretta Wu is welcome to join group at next session.   Follow Up Instructions: Counselor to provide link for next session via Webex platform. The Loretta Wu was advised to call back or seek an in-person evaluation if the symptoms worsen or if the condition fails to improve as anticipated.  I provided 90 minutes of non-face-to-face time during  this encounter.   Lise Auer, LCSW

## 2021-07-07 DIAGNOSIS — R69 Illness, unspecified: Secondary | ICD-10-CM | POA: Diagnosis not present

## 2021-07-14 DIAGNOSIS — R69 Illness, unspecified: Secondary | ICD-10-CM | POA: Diagnosis not present

## 2021-07-26 DIAGNOSIS — Z79899 Other long term (current) drug therapy: Secondary | ICD-10-CM | POA: Diagnosis not present

## 2021-07-26 DIAGNOSIS — Z130 Encounter for screening for diseases of the blood and blood-forming organs and certain disorders involving the immune mechanism: Secondary | ICD-10-CM | POA: Diagnosis not present

## 2021-07-26 DIAGNOSIS — E785 Hyperlipidemia, unspecified: Secondary | ICD-10-CM | POA: Diagnosis not present

## 2021-07-26 DIAGNOSIS — Z1329 Encounter for screening for other suspected endocrine disorder: Secondary | ICD-10-CM | POA: Diagnosis not present

## 2021-08-01 ENCOUNTER — Ambulatory Visit (HOSPITAL_COMMUNITY): Payer: 59 | Admitting: Psychiatry

## 2021-08-01 ENCOUNTER — Encounter (HOSPITAL_COMMUNITY): Payer: Self-pay | Admitting: Psychiatry

## 2021-08-01 ENCOUNTER — Other Ambulatory Visit: Payer: Self-pay

## 2021-08-01 DIAGNOSIS — F331 Major depressive disorder, recurrent, moderate: Secondary | ICD-10-CM

## 2021-08-01 NOTE — Progress Notes (Signed)
Virtual Visit via Video Note  I connected with Loretta Wu on 08/01/21 at 12:00 PM EDT by a video enabled telemedicine application and verified that I am speaking with the correct person using two identifiers.  I discussed the limitations of evaluation and management by telemedicine and the availability of in person appointments. The patient expressed understanding and agreed to proceed.    Location: Patient: Patient Home Provider: Home Office   GROUP GOAL: Client will attend IOP Aftercare Group Therapy 2-4x a month to connect with peers and to apply strategies discussed to improve mental health condition.  History of Present Illness: MDD  Observations/Objective: Counselor met with Patient in the context of Group Therapy, via Webex. Counselor prompted Patient to share updates on coping skill application and individual therapeutic process in management of mental health. Client reports intentional efforts to maintain mental health.  Counselor prompted Patient to share areas of concern, challenges and identify barriers to meeting current goals. Group topics covered: specific treatments for trauma, securing new providers, body image, self-concepts, OCD symptoms, negative cognitions, insomnia, ADLs, living with others who are not managing mental health.  Patient engaged in discussion, provided feedback for others within the group and took note of additional strategies reviewed and discussed.   Assessment and Plan: Counselor recommends client continue following treatment plan goals, following crisis plan and following up with behavioral health and medical providers as needed. Patient is welcome to join group at next session.   Follow Up Instructions: Counselor to provide link for next session via Webex platform. The patient was advised to call back or seek an in-person evaluation if the symptoms worsen or if the condition fails to improve as anticipated.  I provided 90 minutes of  non-face-to-face time during this encounter.   Lise Auer, LCSW

## 2021-08-02 DIAGNOSIS — H40053 Ocular hypertension, bilateral: Secondary | ICD-10-CM | POA: Diagnosis not present

## 2021-08-03 DIAGNOSIS — M5416 Radiculopathy, lumbar region: Secondary | ICD-10-CM | POA: Diagnosis not present

## 2021-08-11 DIAGNOSIS — R69 Illness, unspecified: Secondary | ICD-10-CM | POA: Diagnosis not present

## 2021-08-21 ENCOUNTER — Other Ambulatory Visit: Payer: Self-pay | Admitting: Internal Medicine

## 2021-08-21 DIAGNOSIS — Z1231 Encounter for screening mammogram for malignant neoplasm of breast: Secondary | ICD-10-CM

## 2021-08-22 ENCOUNTER — Ambulatory Visit (INDEPENDENT_AMBULATORY_CARE_PROVIDER_SITE_OTHER): Payer: 59 | Admitting: Psychiatry

## 2021-08-22 ENCOUNTER — Other Ambulatory Visit: Payer: Self-pay

## 2021-08-22 DIAGNOSIS — F411 Generalized anxiety disorder: Secondary | ICD-10-CM | POA: Diagnosis not present

## 2021-08-22 DIAGNOSIS — R69 Illness, unspecified: Secondary | ICD-10-CM | POA: Diagnosis not present

## 2021-08-22 DIAGNOSIS — F4310 Post-traumatic stress disorder, unspecified: Secondary | ICD-10-CM | POA: Diagnosis not present

## 2021-08-22 DIAGNOSIS — F331 Major depressive disorder, recurrent, moderate: Secondary | ICD-10-CM

## 2021-08-23 ENCOUNTER — Encounter (HOSPITAL_COMMUNITY): Payer: Self-pay | Admitting: Psychiatry

## 2021-08-23 DIAGNOSIS — R69 Illness, unspecified: Secondary | ICD-10-CM | POA: Diagnosis not present

## 2021-08-23 NOTE — Addendum Note (Signed)
Addended by: Hilbert Odor on: 08/23/2021 01:23 PM   Modules accepted: Level of Service

## 2021-08-23 NOTE — Progress Notes (Signed)
Virtual Visit via Video Note  I connected with Loretta Wu on 08/22/21 at 12:00 PM EDT by a video enabled telemedicine application and verified that I am speaking with the correct person using two identifiers.  I discussed the limitations of evaluation and management by telemedicine and the availability of in person appointments. The patient expressed understanding and agreed to proceed.    Location: Patient: Patient Home Provider: Home Office   GROUP GOAL: Client will attend IOP Aftercare Group Therapy 2-4x a month to connect with peers and to apply strategies discussed to improve mental health condition.  History of Present Illness: MDD, GAD, PTSD  Observations/Objective: Counselor met with Patient in the context of Group Therapy, via Webex. Counselor prompted Patient to share updates on coping skill application and individual therapeutic process in management of mental health. Client reports intentional efforts to maintain mental health.  Counselor prompted Patient to share areas of concern, challenges and identify barriers to meeting current goals. Group topics covered: local resources, anger, grief and loss, progress in functioning, utilizing natural supports, communication with providers, medication management and concerns, and improving PTSD symptoms.  Patient engaged in discussion, provided feedback for others within the group and took note of additional strategies reviewed and discussed.   Assessment and Plan: Counselor recommends client continue following treatment plan goals, following crisis plan and following up with behavioral health and medical providers as needed. Patient is welcome to join group at next session.   Follow Up Instructions: Counselor to provide link for next session via Webex platform. The patient was advised to call back or seek an in-person evaluation if the symptoms worsen or if the condition fails to improve as anticipated.  I provided 90 minutes of  non-face-to-face time during this encounter.   Lise Auer, LCSW

## 2021-08-29 ENCOUNTER — Ambulatory Visit (INDEPENDENT_AMBULATORY_CARE_PROVIDER_SITE_OTHER): Payer: 59 | Admitting: Psychiatry

## 2021-08-29 ENCOUNTER — Other Ambulatory Visit: Payer: Self-pay

## 2021-08-29 ENCOUNTER — Encounter (HOSPITAL_COMMUNITY): Payer: Self-pay | Admitting: Psychiatry

## 2021-08-29 DIAGNOSIS — F331 Major depressive disorder, recurrent, moderate: Secondary | ICD-10-CM

## 2021-08-29 DIAGNOSIS — F411 Generalized anxiety disorder: Secondary | ICD-10-CM | POA: Diagnosis not present

## 2021-08-29 DIAGNOSIS — R69 Illness, unspecified: Secondary | ICD-10-CM | POA: Diagnosis not present

## 2021-08-29 NOTE — Progress Notes (Signed)
Virtual Visit via Video Note  I connected with Loretta Wu on 08/29/21 at  1:00 PM EDT by a video enabled telemedicine application and verified that I am speaking with the correct person using two identifiers.  I discussed the limitations of evaluation and management by telemedicine and the availability of in person appointments. The patient expressed understanding and agreed to proceed.    Location: Patient: Patient Home Provider: Home Office   GROUP GOAL: Client will attend IOP Aftercare Group Therapy 2-4x a month to connect with peers and to apply strategies discussed to improve mental health condition.  History of Present Illness: MDD GAD  Observations/Objective: Counselor met with Patient in the context of Group Therapy, via Webex. Counselor prompted Patient to share updates on coping skill application and individual therapeutic process in management of mental health. Client reports intentional efforts to maintain mental health.  Counselor prompted Patient to share areas of concern, challenges and identify barriers to meeting current goals. Group topics covered: specialized treatment process and outcomes, continuity of care, community resources, positive affirmations, grief work, boundaries, regaining self-confidence, combating depression and anxiety with coping strategies.  Patient engaged in discussion, provided feedback for others within the group and took note of additional strategies reviewed and discussed.   Assessment and Plan: Counselor recommends client continue following treatment plan goals, following crisis plan and following up with behavioral health and medical providers as needed. Patient is welcome to join group at next session.   Follow Up Instructions: Counselor to provide link for next session via Webex platform. The patient was advised to call back or seek an in-person evaluation if the symptoms worsen or if the condition fails to improve as anticipated.  I  provided 60 minutes of non-face-to-face time during this encounter.   Lise Auer, LCSW

## 2021-09-05 ENCOUNTER — Other Ambulatory Visit: Payer: Self-pay

## 2021-09-05 ENCOUNTER — Ambulatory Visit (INDEPENDENT_AMBULATORY_CARE_PROVIDER_SITE_OTHER): Payer: 59 | Admitting: Psychiatry

## 2021-09-05 ENCOUNTER — Ambulatory Visit (HOSPITAL_COMMUNITY): Payer: 59 | Admitting: Psychiatry

## 2021-09-05 DIAGNOSIS — F411 Generalized anxiety disorder: Secondary | ICD-10-CM | POA: Diagnosis not present

## 2021-09-05 DIAGNOSIS — F331 Major depressive disorder, recurrent, moderate: Secondary | ICD-10-CM | POA: Diagnosis not present

## 2021-09-05 DIAGNOSIS — R69 Illness, unspecified: Secondary | ICD-10-CM | POA: Diagnosis not present

## 2021-09-06 ENCOUNTER — Encounter (HOSPITAL_COMMUNITY): Payer: Self-pay | Admitting: Psychiatry

## 2021-09-06 NOTE — Progress Notes (Signed)
Virtual Visit via Video Note  I connected with Kristle Wesch on 09/05/21 at  1:00 PM EDT by a video enabled telemedicine application and verified that I am speaking with the correct person using two identifiers.  I discussed the limitations of evaluation and management by telemedicine and the availability of in person appointments. The patient expressed understanding and agreed to proceed.    Location: Patient: Patient Home Provider: Home Office   GROUP GOAL: Client will attend IOP Aftercare Group Therapy 2-4x a month to connect with peers and to apply strategies discussed to improve mental health condition.  History of Present Illness: MDD Bipolar DO  Observations/Objective: Counselor met with Patient in the context of Group Therapy, via Webex. Counselor prompted Patient to share updates on coping skill application and individual therapeutic process in management of mental health. Client reports intentional efforts to maintain mental health.  Counselor prompted Patient to share areas of concern, challenges and identify barriers to meeting current goals. Group topics covered: passive suicidal thoughts, finding life purpose and meaning, grief and loss, medication side effects, assertive communication, avoiding escaping behaviors, genesite testing and self-compassion.  Patient engaged in discussion, provided feedback for others within the group and took note of additional strategies reviewed and discussed.   Assessment and Plan: Counselor recommends client continue following treatment plan goals, following crisis plan and following up with behavioral health and medical providers as needed. Patient is welcome to join group at next session.   Follow Up Instructions: Counselor to provide link for next session via Webex platform. The patient was advised to call back or seek an in-person evaluation if the symptoms worsen or if the condition fails to improve as anticipated.  I provided 60 minutes  of non-face-to-face time during this encounter.   Lise Auer, LCSW

## 2021-09-11 ENCOUNTER — Ambulatory Visit (HOSPITAL_COMMUNITY): Payer: 59 | Admitting: Licensed Clinical Social Worker

## 2021-09-11 DIAGNOSIS — R69 Illness, unspecified: Secondary | ICD-10-CM | POA: Diagnosis not present

## 2021-09-12 ENCOUNTER — Ambulatory Visit (INDEPENDENT_AMBULATORY_CARE_PROVIDER_SITE_OTHER): Payer: 59 | Admitting: Psychiatry

## 2021-09-12 ENCOUNTER — Other Ambulatory Visit: Payer: Self-pay

## 2021-09-12 DIAGNOSIS — F331 Major depressive disorder, recurrent, moderate: Secondary | ICD-10-CM | POA: Diagnosis not present

## 2021-09-12 DIAGNOSIS — F411 Generalized anxiety disorder: Secondary | ICD-10-CM

## 2021-09-12 DIAGNOSIS — R69 Illness, unspecified: Secondary | ICD-10-CM | POA: Diagnosis not present

## 2021-09-13 DIAGNOSIS — M5412 Radiculopathy, cervical region: Secondary | ICD-10-CM | POA: Diagnosis not present

## 2021-09-14 ENCOUNTER — Encounter (HOSPITAL_COMMUNITY): Payer: Self-pay | Admitting: Psychiatry

## 2021-09-14 NOTE — Progress Notes (Signed)
Virtual Visit via Video Note  I connected with Loretta Wu on 09/12/21 at  1:00 PM EDT by a video enabled telemedicine application and verified that I am speaking with the correct person using two identifiers.  I discussed the limitations of evaluation and management by telemedicine and the availability of in person appointments. The patient expressed understanding and agreed to proceed.    Location: Patient: Patient Home Provider: Home Office   GROUP GOAL: Client will attend IOP Aftercare Group Therapy 2-4x a month to connect with peers and to apply strategies discussed to improve mental health condition.  History of Present Illness: MDD and GAD  Observations/Objective: Counselor met with Patient in the context of Group Therapy, via Webex. Counselor prompted Patient to share updates on coping skill application and individual therapeutic process in management of mental health. Client reports intentional efforts to maintain mental health.  Counselor prompted Patient to share areas of concern, challenges and identify barriers to meeting current goals. Group topics covered: exploring new supports and interests, navigating relationships, communication and boundary setting.  Patient engaged in discussion, provided feedback for others within the group and took note of additional strategies reviewed and discussed.   Counselor shared that today would be one of our last group sessions, as Counselor will be ending time with [facility] effective October 1. Client was understanding and we discussed an appropriate transfer of care. Client to remain established with practice for counseling and psychiatry. Client aware of how to follow up and follow safety plan if needed.   Assessment and Plan: Counselor will meet with patient once more to provider group treatment before discharging or transferring care. Patient will continue to follow recommendations of providers and implement skills learned in session.    Follow Up Instructions: Counselor to send out list of potential providers for Clients to receive group treatment via mail.   The patient was advised to call back or seek an in-person evaluation if the symptoms worsen or if the condition fails to improve as anticipated.  I provided 60 minutes of non-face-to-face time during this encounter.   Lise Auer, LCSW

## 2021-09-18 DIAGNOSIS — R69 Illness, unspecified: Secondary | ICD-10-CM | POA: Diagnosis not present

## 2021-10-02 ENCOUNTER — Ambulatory Visit
Admission: RE | Admit: 2021-10-02 | Discharge: 2021-10-02 | Disposition: A | Discharge status: Home / Self Care | Payer: 59 | Source: Ambulatory Visit | Attending: Internal Medicine | Admitting: Internal Medicine

## 2021-10-02 ENCOUNTER — Ambulatory Visit: Payer: Self-pay

## 2021-10-02 ENCOUNTER — Other Ambulatory Visit: Payer: Self-pay

## 2021-10-02 DIAGNOSIS — Z1231 Encounter for screening mammogram for malignant neoplasm of breast: Secondary | ICD-10-CM | POA: Diagnosis not present

## 2021-10-09 DIAGNOSIS — R69 Illness, unspecified: Secondary | ICD-10-CM | POA: Diagnosis not present

## 2021-10-19 DIAGNOSIS — M5412 Radiculopathy, cervical region: Secondary | ICD-10-CM | POA: Diagnosis not present

## 2021-11-06 DIAGNOSIS — M5416 Radiculopathy, lumbar region: Secondary | ICD-10-CM | POA: Diagnosis not present

## 2021-11-16 DIAGNOSIS — R69 Illness, unspecified: Secondary | ICD-10-CM | POA: Diagnosis not present

## 2021-11-27 DIAGNOSIS — R69 Illness, unspecified: Secondary | ICD-10-CM | POA: Diagnosis not present

## 2021-12-11 DIAGNOSIS — R69 Illness, unspecified: Secondary | ICD-10-CM | POA: Diagnosis not present

## 2022-01-08 DIAGNOSIS — R69 Illness, unspecified: Secondary | ICD-10-CM | POA: Diagnosis not present

## 2022-01-22 DIAGNOSIS — R69 Illness, unspecified: Secondary | ICD-10-CM | POA: Diagnosis not present

## 2022-02-01 DIAGNOSIS — R69 Illness, unspecified: Secondary | ICD-10-CM | POA: Diagnosis not present

## 2022-02-08 ENCOUNTER — Other Ambulatory Visit (HOSPITAL_COMMUNITY)
Admission: RE | Admit: 2022-02-08 | Discharge: 2022-02-08 | Disposition: A | Discharge status: Home / Self Care | Payer: 59 | Source: Ambulatory Visit | Attending: Obstetrics and Gynecology | Admitting: Obstetrics and Gynecology

## 2022-02-08 ENCOUNTER — Other Ambulatory Visit: Payer: Self-pay | Admitting: Obstetrics and Gynecology

## 2022-02-08 DIAGNOSIS — Z01419 Encounter for gynecological examination (general) (routine) without abnormal findings: Secondary | ICD-10-CM | POA: Diagnosis not present

## 2022-02-08 DIAGNOSIS — N898 Other specified noninflammatory disorders of vagina: Secondary | ICD-10-CM | POA: Diagnosis not present

## 2022-02-08 DIAGNOSIS — E785 Hyperlipidemia, unspecified: Secondary | ICD-10-CM | POA: Diagnosis not present

## 2022-02-08 DIAGNOSIS — Z130 Encounter for screening for diseases of the blood and blood-forming organs and certain disorders involving the immune mechanism: Secondary | ICD-10-CM | POA: Diagnosis not present

## 2022-02-12 LAB — CYTOLOGY - PAP
Comment: NEGATIVE
Diagnosis: UNDETERMINED — AB
High risk HPV: NEGATIVE

## 2022-02-15 DIAGNOSIS — R69 Illness, unspecified: Secondary | ICD-10-CM | POA: Diagnosis not present

## 2022-03-02 DIAGNOSIS — M5136 Other intervertebral disc degeneration, lumbar region: Secondary | ICD-10-CM | POA: Diagnosis not present

## 2022-03-02 DIAGNOSIS — M5416 Radiculopathy, lumbar region: Secondary | ICD-10-CM | POA: Diagnosis not present

## 2022-03-02 DIAGNOSIS — M5412 Radiculopathy, cervical region: Secondary | ICD-10-CM | POA: Diagnosis not present

## 2022-03-02 DIAGNOSIS — M5459 Other low back pain: Secondary | ICD-10-CM | POA: Diagnosis not present

## 2022-03-02 DIAGNOSIS — G894 Chronic pain syndrome: Secondary | ICD-10-CM | POA: Diagnosis not present

## 2022-03-07 DIAGNOSIS — M5416 Radiculopathy, lumbar region: Secondary | ICD-10-CM | POA: Diagnosis not present

## 2022-03-08 DIAGNOSIS — R69 Illness, unspecified: Secondary | ICD-10-CM | POA: Diagnosis not present

## 2022-03-14 DIAGNOSIS — M5416 Radiculopathy, lumbar region: Secondary | ICD-10-CM | POA: Diagnosis not present

## 2022-03-21 DIAGNOSIS — M5416 Radiculopathy, lumbar region: Secondary | ICD-10-CM | POA: Diagnosis not present

## 2022-03-28 DIAGNOSIS — M5416 Radiculopathy, lumbar region: Secondary | ICD-10-CM | POA: Diagnosis not present

## 2022-03-28 IMAGING — MG MM DIGITAL SCREENING BILAT W/ TOMO AND CAD
8 of 16 series · 8 of 40 positions shown · non-contrast
Comparison: Previous exam(s).

CLINICAL DATA: Screening.

EXAM:
DIGITAL SCREENING BILATERAL MAMMOGRAM WITH TOMOSYNTHESIS AND CAD
TECHNIQUE: Bilateral screening digital craniocaudal and mediolateral oblique
mammograms were obtained. Bilateral screening digital breast
tomosynthesis was performed. The images were evaluated with
computer-aided detection.

[R CC synth-2D]
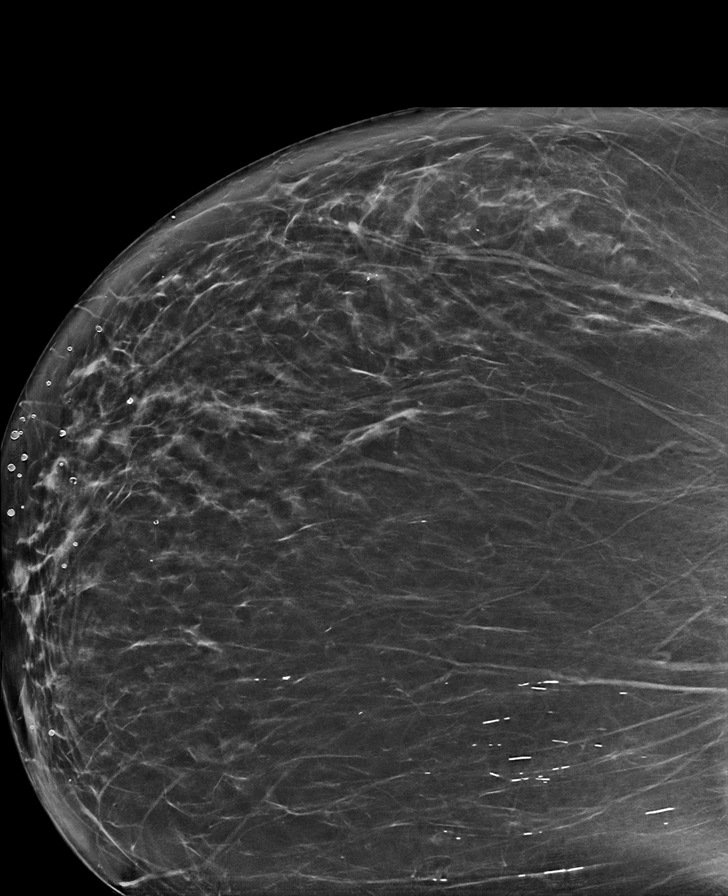

[L CC synth-2D]
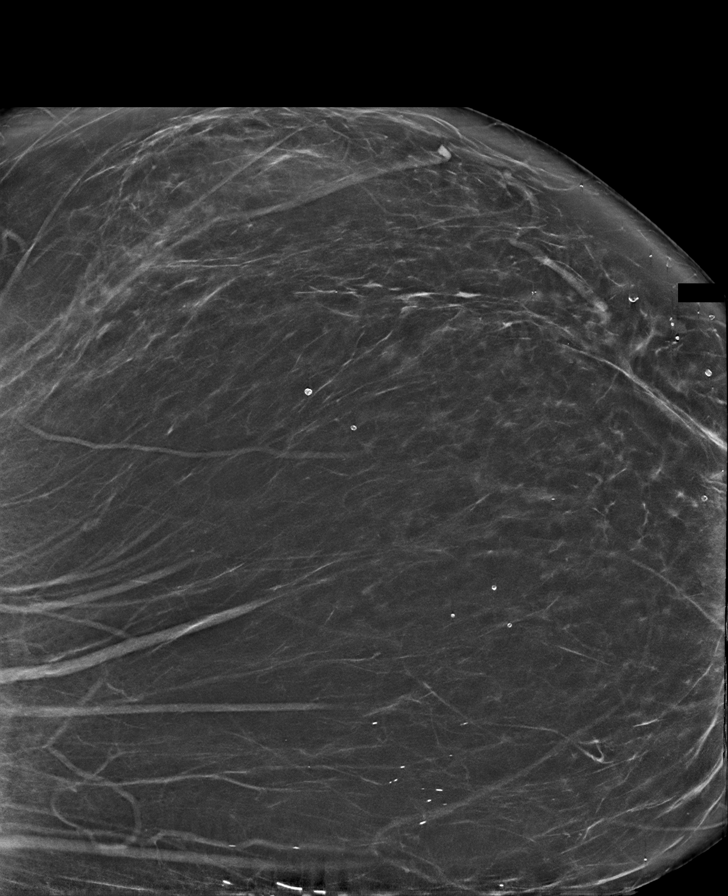

[L MLO synth-2D (1 of 2)]
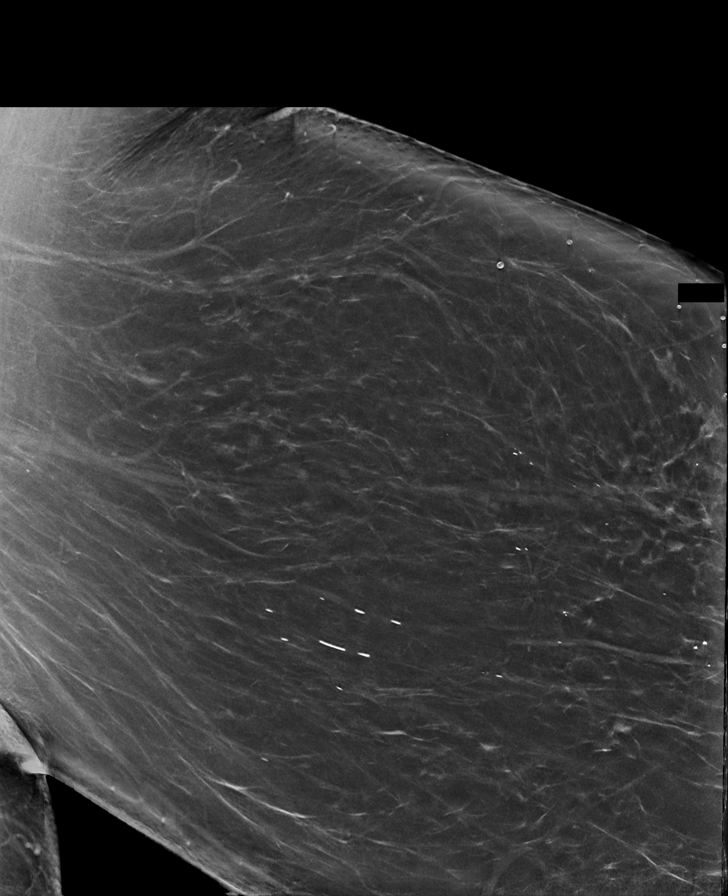

[L CV synth-2D]
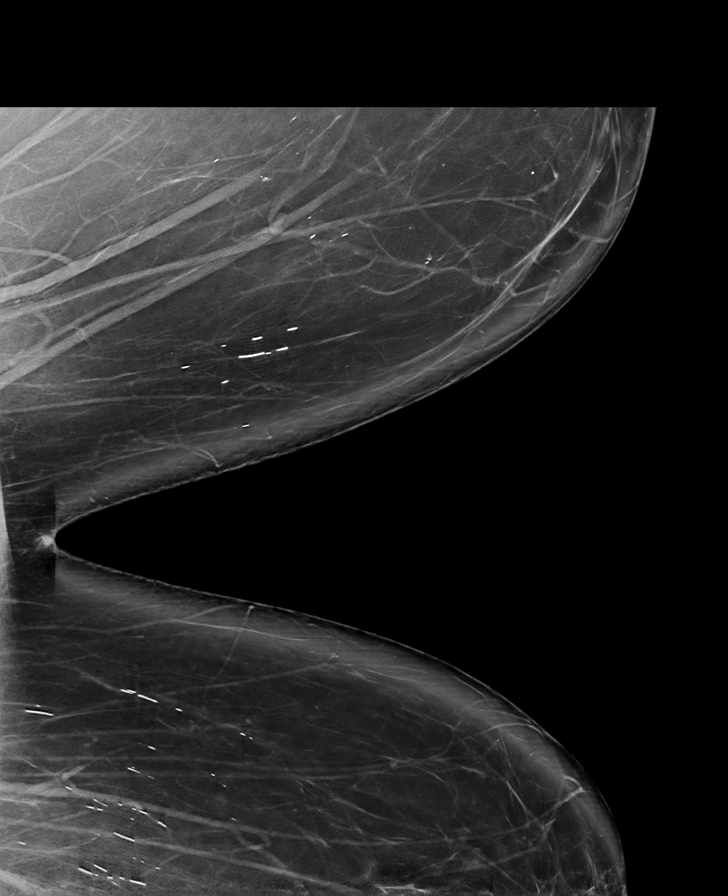

[R MLO synth-2D (1 of 2)]
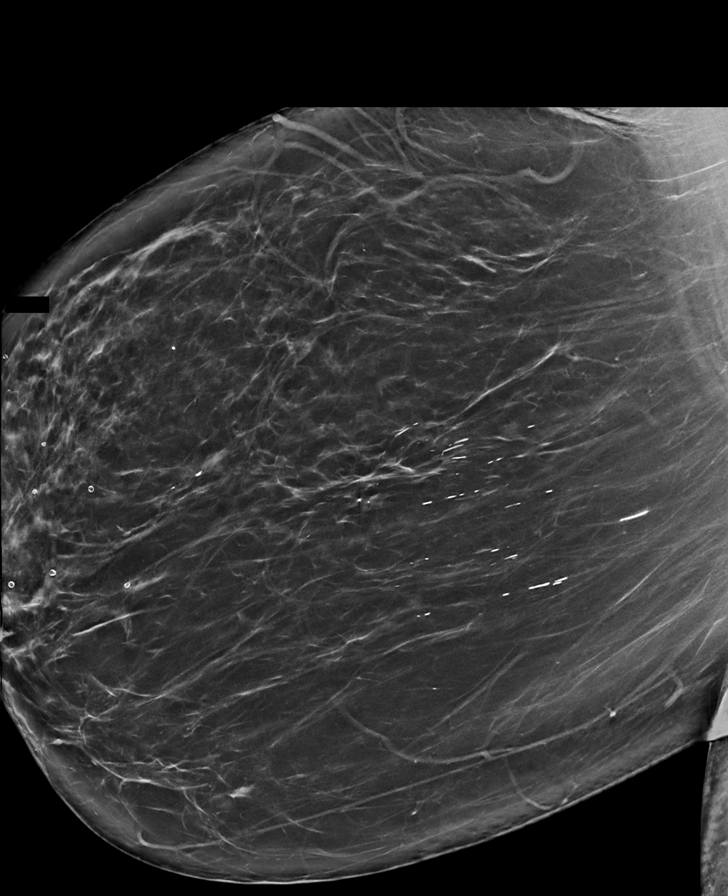

[L MLO synth-2D (2 of 2)]
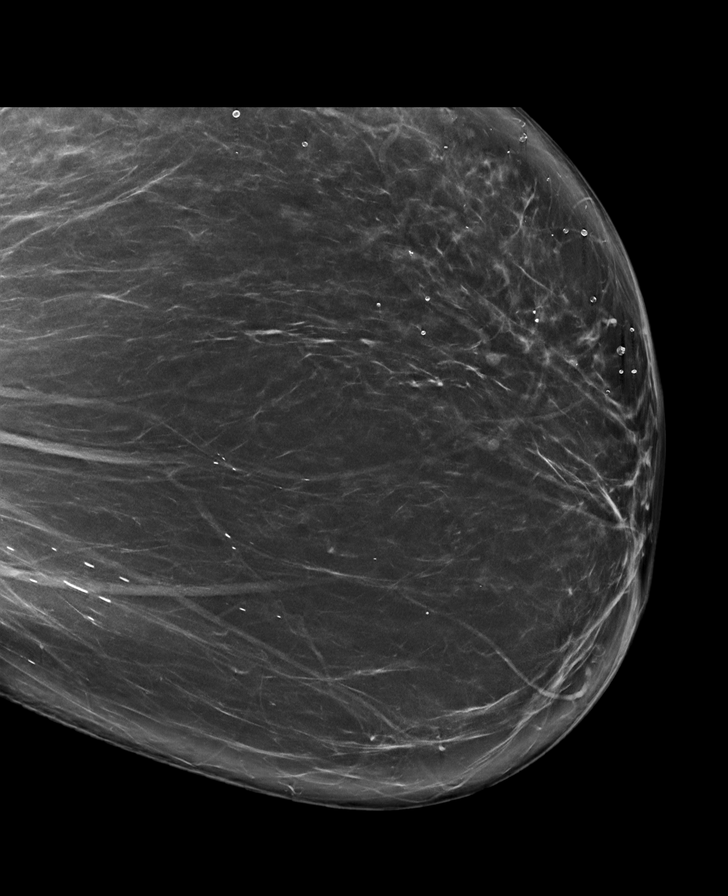

[R MLO synth-2D (2 of 2)]
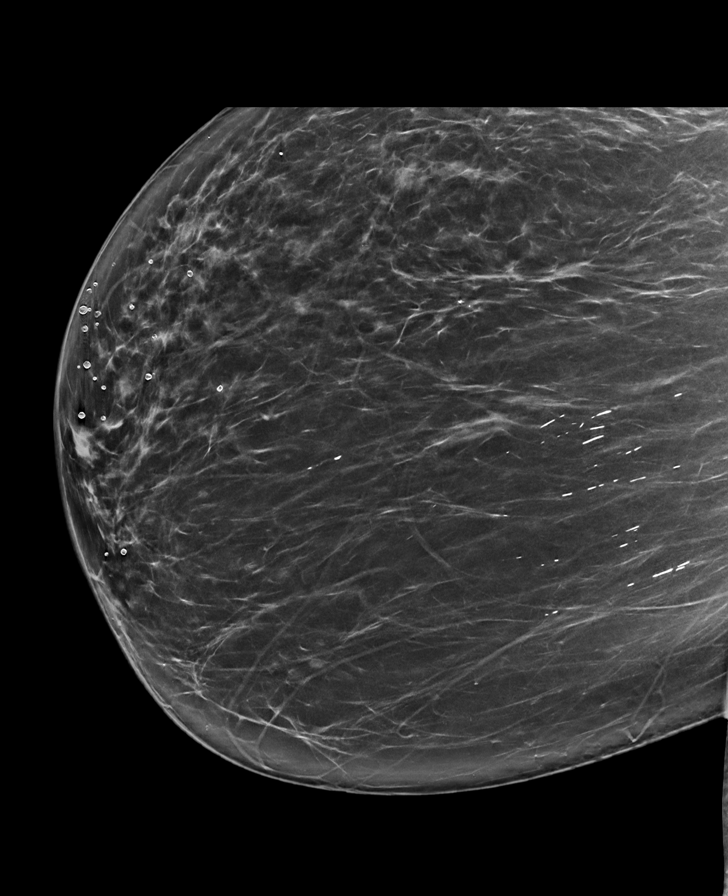

[R CC tomo · tomo slice 39/77.0]
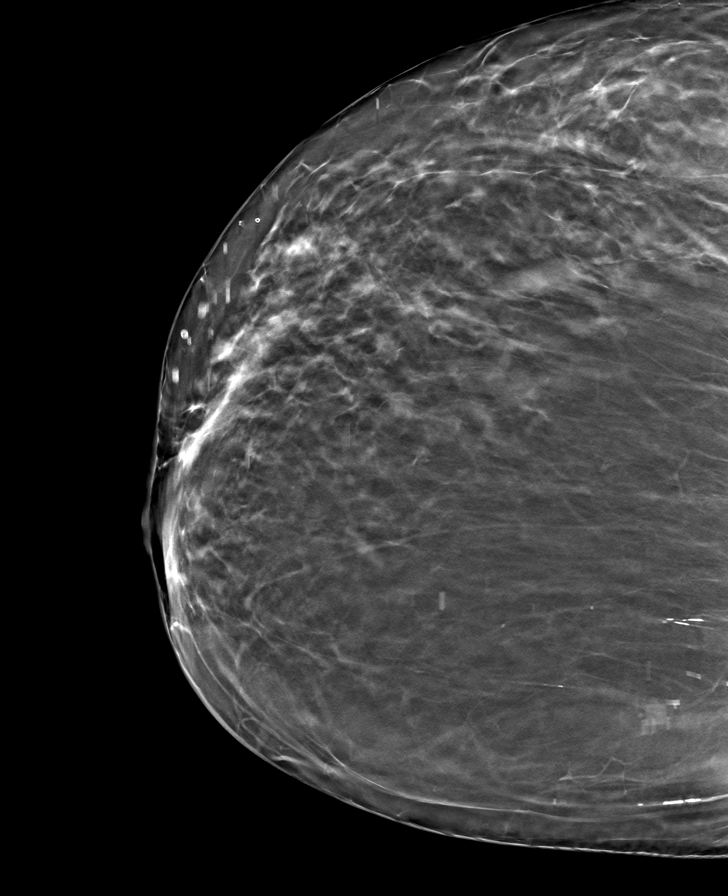

[8 of 40 positions shown; findings below may reference images not displayed]

ACR Breast Density Category b: There are scattered areas of
fibroglandular density.
FINDINGS: There are no findings suspicious for malignancy.
IMPRESSION: No mammographic evidence of malignancy. A result letter of this
screening mammogram will be mailed directly to the patient.

RECOMMENDATION:
Screening mammogram in one year. (Code:51-O-LD2)

BI-RADS CATEGORY  1: Negative.

## 2022-03-29 DIAGNOSIS — R69 Illness, unspecified: Secondary | ICD-10-CM | POA: Diagnosis not present

## 2022-04-04 DIAGNOSIS — M5416 Radiculopathy, lumbar region: Secondary | ICD-10-CM | POA: Diagnosis not present

## 2022-04-19 DIAGNOSIS — R69 Illness, unspecified: Secondary | ICD-10-CM | POA: Diagnosis not present

## 2022-05-01 DIAGNOSIS — N3010 Interstitial cystitis (chronic) without hematuria: Secondary | ICD-10-CM | POA: Diagnosis not present

## 2022-05-16 DIAGNOSIS — R69 Illness, unspecified: Secondary | ICD-10-CM | POA: Diagnosis not present

## 2022-05-17 DIAGNOSIS — M5412 Radiculopathy, cervical region: Secondary | ICD-10-CM | POA: Diagnosis not present

## 2022-06-14 DIAGNOSIS — R69 Illness, unspecified: Secondary | ICD-10-CM | POA: Diagnosis not present

## 2022-07-06 DIAGNOSIS — G894 Chronic pain syndrome: Secondary | ICD-10-CM | POA: Diagnosis not present

## 2022-07-11 DIAGNOSIS — R69 Illness, unspecified: Secondary | ICD-10-CM | POA: Diagnosis not present

## 2022-07-30 DIAGNOSIS — R69 Illness, unspecified: Secondary | ICD-10-CM | POA: Diagnosis not present

## 2022-08-31 ENCOUNTER — Other Ambulatory Visit: Payer: Self-pay | Admitting: Obstetrics and Gynecology

## 2022-08-31 DIAGNOSIS — Z1231 Encounter for screening mammogram for malignant neoplasm of breast: Secondary | ICD-10-CM

## 2022-09-11 DIAGNOSIS — H40053 Ocular hypertension, bilateral: Secondary | ICD-10-CM | POA: Diagnosis not present

## 2022-10-03 ENCOUNTER — Ambulatory Visit: Payer: 59

## 2022-10-22 DIAGNOSIS — R269 Unspecified abnormalities of gait and mobility: Secondary | ICD-10-CM | POA: Diagnosis not present

## 2022-10-22 DIAGNOSIS — G8929 Other chronic pain: Secondary | ICD-10-CM | POA: Diagnosis not present

## 2022-10-22 DIAGNOSIS — H04129 Dry eye syndrome of unspecified lacrimal gland: Secondary | ICD-10-CM | POA: Diagnosis not present

## 2022-10-22 DIAGNOSIS — H409 Unspecified glaucoma: Secondary | ICD-10-CM | POA: Diagnosis not present

## 2022-10-22 DIAGNOSIS — F411 Generalized anxiety disorder: Secondary | ICD-10-CM | POA: Diagnosis not present

## 2022-10-22 DIAGNOSIS — E039 Hypothyroidism, unspecified: Secondary | ICD-10-CM | POA: Diagnosis not present

## 2022-10-22 DIAGNOSIS — F4310 Post-traumatic stress disorder, unspecified: Secondary | ICD-10-CM | POA: Diagnosis not present

## 2022-10-22 DIAGNOSIS — R69 Illness, unspecified: Secondary | ICD-10-CM | POA: Diagnosis not present

## 2022-10-22 DIAGNOSIS — I10 Essential (primary) hypertension: Secondary | ICD-10-CM | POA: Diagnosis not present

## 2022-10-22 DIAGNOSIS — K08409 Partial loss of teeth, unspecified cause, unspecified class: Secondary | ICD-10-CM | POA: Diagnosis not present

## 2022-10-22 DIAGNOSIS — I739 Peripheral vascular disease, unspecified: Secondary | ICD-10-CM | POA: Diagnosis not present

## 2022-10-25 ENCOUNTER — Ambulatory Visit: Payer: 59

## 2022-11-14 DIAGNOSIS — E782 Mixed hyperlipidemia: Secondary | ICD-10-CM | POA: Diagnosis not present

## 2022-11-14 DIAGNOSIS — R69 Illness, unspecified: Secondary | ICD-10-CM | POA: Diagnosis not present

## 2022-11-14 DIAGNOSIS — M5136 Other intervertebral disc degeneration, lumbar region: Secondary | ICD-10-CM | POA: Diagnosis not present

## 2022-11-14 DIAGNOSIS — L02429 Furuncle of limb, unspecified: Secondary | ICD-10-CM | POA: Diagnosis not present

## 2022-11-14 DIAGNOSIS — I10 Essential (primary) hypertension: Secondary | ICD-10-CM | POA: Diagnosis not present

## 2022-11-20 ENCOUNTER — Ambulatory Visit
Admission: RE | Admit: 2022-11-20 | Discharge: 2022-11-20 | Disposition: A | Discharge status: Home / Self Care | Payer: 59 | Source: Ambulatory Visit | Attending: Obstetrics and Gynecology | Admitting: Obstetrics and Gynecology

## 2022-11-20 DIAGNOSIS — Z1231 Encounter for screening mammogram for malignant neoplasm of breast: Secondary | ICD-10-CM

## 2022-12-29 DIAGNOSIS — G894 Chronic pain syndrome: Secondary | ICD-10-CM | POA: Diagnosis not present

## 2023-02-18 DIAGNOSIS — I10 Essential (primary) hypertension: Secondary | ICD-10-CM | POA: Diagnosis not present

## 2023-02-18 DIAGNOSIS — E782 Mixed hyperlipidemia: Secondary | ICD-10-CM | POA: Diagnosis not present

## 2023-02-18 DIAGNOSIS — R400 Somnolence: Secondary | ICD-10-CM | POA: Diagnosis not present

## 2023-02-18 DIAGNOSIS — R350 Frequency of micturition: Secondary | ICD-10-CM | POA: Diagnosis not present

## 2023-03-28 DIAGNOSIS — G894 Chronic pain syndrome: Secondary | ICD-10-CM | POA: Diagnosis not present

## 2023-03-28 DIAGNOSIS — M5030 Other cervical disc degeneration, unspecified cervical region: Secondary | ICD-10-CM | POA: Diagnosis not present

## 2023-03-28 DIAGNOSIS — M542 Cervicalgia: Secondary | ICD-10-CM | POA: Diagnosis not present

## 2023-03-28 DIAGNOSIS — M5459 Other low back pain: Secondary | ICD-10-CM | POA: Diagnosis not present

## 2023-03-28 DIAGNOSIS — M5136 Other intervertebral disc degeneration, lumbar region: Secondary | ICD-10-CM | POA: Diagnosis not present

## 2023-06-18 DIAGNOSIS — H40053 Ocular hypertension, bilateral: Secondary | ICD-10-CM | POA: Diagnosis not present

## 2023-06-26 DIAGNOSIS — Z5181 Encounter for therapeutic drug level monitoring: Secondary | ICD-10-CM | POA: Diagnosis not present

## 2023-06-26 DIAGNOSIS — M5412 Radiculopathy, cervical region: Secondary | ICD-10-CM | POA: Diagnosis not present

## 2023-06-26 DIAGNOSIS — M5459 Other low back pain: Secondary | ICD-10-CM | POA: Diagnosis not present

## 2023-06-26 DIAGNOSIS — M5030 Other cervical disc degeneration, unspecified cervical region: Secondary | ICD-10-CM | POA: Diagnosis not present

## 2023-06-26 DIAGNOSIS — Z79899 Other long term (current) drug therapy: Secondary | ICD-10-CM | POA: Diagnosis not present

## 2023-07-09 DIAGNOSIS — E785 Hyperlipidemia, unspecified: Secondary | ICD-10-CM | POA: Diagnosis not present

## 2023-07-09 DIAGNOSIS — Z130 Encounter for screening for diseases of the blood and blood-forming organs and certain disorders involving the immune mechanism: Secondary | ICD-10-CM | POA: Diagnosis not present

## 2023-07-09 DIAGNOSIS — Z1329 Encounter for screening for other suspected endocrine disorder: Secondary | ICD-10-CM | POA: Diagnosis not present

## 2023-07-09 DIAGNOSIS — Z79899 Other long term (current) drug therapy: Secondary | ICD-10-CM | POA: Diagnosis not present

## 2023-10-25 ENCOUNTER — Other Ambulatory Visit: Payer: Self-pay | Admitting: Obstetrics and Gynecology

## 2023-10-25 DIAGNOSIS — Z1231 Encounter for screening mammogram for malignant neoplasm of breast: Secondary | ICD-10-CM

## 2023-10-28 DIAGNOSIS — M791 Myalgia, unspecified site: Secondary | ICD-10-CM | POA: Diagnosis not present

## 2023-10-28 DIAGNOSIS — M542 Cervicalgia: Secondary | ICD-10-CM | POA: Diagnosis not present

## 2023-10-28 DIAGNOSIS — M5412 Radiculopathy, cervical region: Secondary | ICD-10-CM | POA: Diagnosis not present

## 2023-11-11 DIAGNOSIS — M5030 Other cervical disc degeneration, unspecified cervical region: Secondary | ICD-10-CM | POA: Diagnosis not present

## 2023-11-11 DIAGNOSIS — Z1159 Encounter for screening for other viral diseases: Secondary | ICD-10-CM | POA: Diagnosis not present

## 2023-11-11 DIAGNOSIS — R7303 Prediabetes: Secondary | ICD-10-CM | POA: Diagnosis not present

## 2023-11-11 DIAGNOSIS — E782 Mixed hyperlipidemia: Secondary | ICD-10-CM | POA: Diagnosis not present

## 2023-11-11 DIAGNOSIS — E039 Hypothyroidism, unspecified: Secondary | ICD-10-CM | POA: Diagnosis not present

## 2023-11-11 DIAGNOSIS — I10 Essential (primary) hypertension: Secondary | ICD-10-CM | POA: Diagnosis not present

## 2023-11-11 DIAGNOSIS — L03116 Cellulitis of left lower limb: Secondary | ICD-10-CM | POA: Diagnosis not present

## 2023-11-11 DIAGNOSIS — Z23 Encounter for immunization: Secondary | ICD-10-CM | POA: Diagnosis not present

## 2023-11-11 DIAGNOSIS — Z0000 Encounter for general adult medical examination without abnormal findings: Secondary | ICD-10-CM | POA: Diagnosis not present

## 2023-11-21 ENCOUNTER — Other Ambulatory Visit: Payer: Self-pay

## 2023-11-21 DIAGNOSIS — R9389 Abnormal findings on diagnostic imaging of other specified body structures: Secondary | ICD-10-CM | POA: Diagnosis not present

## 2023-11-22 LAB — SURGICAL PATHOLOGY

## 2023-11-25 ENCOUNTER — Ambulatory Visit
Admission: RE | Admit: 2023-11-25 | Discharge: 2023-11-25 | Disposition: A | Discharge status: Home / Self Care | Payer: Medicare Other | Source: Ambulatory Visit | Attending: Obstetrics and Gynecology | Admitting: Obstetrics and Gynecology

## 2023-11-25 DIAGNOSIS — Z1231 Encounter for screening mammogram for malignant neoplasm of breast: Secondary | ICD-10-CM

## 2023-11-27 DIAGNOSIS — R9389 Abnormal findings on diagnostic imaging of other specified body structures: Secondary | ICD-10-CM | POA: Diagnosis not present

## 2023-12-10 DIAGNOSIS — H40053 Ocular hypertension, bilateral: Secondary | ICD-10-CM | POA: Diagnosis not present

## 2023-12-11 DIAGNOSIS — L308 Other specified dermatitis: Secondary | ICD-10-CM | POA: Diagnosis not present

## 2024-02-12 DIAGNOSIS — E1165 Type 2 diabetes mellitus with hyperglycemia: Secondary | ICD-10-CM | POA: Diagnosis not present

## 2024-02-18 ENCOUNTER — Other Ambulatory Visit: Payer: Self-pay

## 2024-02-18 ENCOUNTER — Other Ambulatory Visit: Payer: Self-pay | Admitting: Obstetrics and Gynecology

## 2024-02-18 ENCOUNTER — Encounter (HOSPITAL_COMMUNITY): Payer: Self-pay | Admitting: Obstetrics and Gynecology

## 2024-02-18 DIAGNOSIS — N950 Postmenopausal bleeding: Secondary | ICD-10-CM

## 2024-02-18 NOTE — H&P (Deleted)
   The note originally documented on this encounter has been moved the the encounter in which it belongs.

## 2024-02-18 NOTE — Progress Notes (Signed)
SDW CALL  Patient was given pre-op instructions over the phone. The opportunity was given for the patient to ask questions. No further questions asked. Patient verbalized understanding of instructions given.   PCP - Dr. Deatra James Cardiologist - denies  PPM/ICD - denies   Chest x-ray - denies EKG - patient states in November 2024 - requested from PCP Stress Test - denies ECHO - denies Cardiac Cath - denies  Sleep Study - denies CPAP - n/a  Patient thinks that she has been diagnosed with diabetes but does not check her blood sugar at home  Last dose of GLP1 agonist-   n/a GLP1 instructions: n/a  Blood Thinner Instructions: n/a Aspirin Instructions: n/a  ERAS Protcol - clears until 0700 PRE-SURGERY Ensure or G2- n/a  COVID TEST- n/a   Anesthesia review: no  Patient denies shortness of breath, fever, cough and chest pain over the phone call   All instructions explained to the patient, with a verbal understanding of the material. Patient agrees to go over the instructions while at home for a better understanding.    Patient states that Freda Jackson will be driving her home tomorrow and patient is aware that she has to have someone stay with her for 24 hours.  Patient's roommate is trying to work it out for him to be at home tomorrow but if he is not able to do this, patient agrees to call back today to let us know.

## 2024-02-18 NOTE — H&P (Signed)
 Reason for Appointment   1.  Preop vt History of Present Illness    General:  63 y/o presents for pre-op visit. Pt is schedule for a  hysteroscopy Dilation and curettage and polypectomy with myosure on 12/18/2024 for the management of PMB and endometrial polyp.   IN REVIEW:  Bleeding started 11/14/2023, lasted 2 days.  She typically wears liner for vaginal discharge.  She noticed bright red blood on her liner during these days.  she denies pelvic pain    She is a type 2 diabetic. She is not currently sexually active. --- TVUS on 11/21/2023, notable for uterus measuring 7.11 x 3.36 x 3.26 cm.  Uterus difficult to clearly visualize and evaluate - no discrete mass seen.  Endometrium 1.69 cm, echogenic with cystic areas - no blood flow.  Neither ovary visualized.  No adnexal mass.  To note, technically difficult study due to uterine position and body habitus. EMB on 11/21/2023 was benign, presene of polyp(s).  Current Medications Taking amLODIPine Besylate-Valsartan 10-320 MG Tablet TAKE 1 TABLET BY MOUTH DAILY Escitalopram Oxalate 20 MG Tablet 1 tablet Orally Once a day Levothyroxine Sodium 25 MCG Tablet 1 tablet in the morning on an empty stomach Orally Once a day LORazepam 1 MG Tablet 1 tablet at bedtime as needed Orally Once a day metFORMIN HCl ER 500 MG Tablet Extended Release 24 Hour 1 tablet with evening meal Orally Once a day traMADol HCl 50 MG Tablet 1 tablet as needed Orally twice a day Dorzolamide HCl-Timolol Mal PF 2-0.5 % Solution 1 drop into affected eye Ophthalmic Twice a day Restasis(cycloSPORINE) 0.05 % Emulsion 1 drop into affected eye Ophthalmic Twice a day Zioptan(Tafluprost (PF)) 0.0015 % Solution 1 drop into affected eye in the evening Ophthalmic Once a day Vitamin D 50 MCG (2000 UT) Capsule 1 capsule Orally Once a day Discontinued Cephalexin 500 MG Capsule 1 capsule Orally twice daily Medication List reviewed and reconciled with the patient Past Medical  History Hypertension. prediabetes- type II diabetes with A1c 7.4 (11/24). Hyperlipidemia. scoliosis/chronic back pain- Dr Ethelene Hal. C6 radiculopathy/ cervical DDD- Ramos/williams (chiro). interstitial cystitis, history of bladder distention and multiple medications,Evans. IBS- diarrhea predominant. anxiety/depression- Dr [name]. attention deficit- Dr [name]. chronic insomnia- Dr [name]. Recurrent boils/skin infections. glucoma /dry eyes, chronic right eye pain- Dr Sherryll Burger. Dr Richardson Dopp- OBGYN. Fredrich Birks OD. Surgical History bladder procedure- Dr Logan Bores Colonscopy 2017 Family History Father: deceased, Hypertension, glaucoma Mother: deceased Paternal Grand Father: deceased Paternal Grand Mother: deceased Maternal Grand Father: deceased Maternal Grand Mother: deceased no family hx for colon cancer or polyps or liver disease, no known hx of gyn cancers. Mom disappeared when pt was 23 years old. Pt does not know about maternal medical hx. Social History    General:  Tobacco use  cigarettes:  Never smoked, Tobacco history last updated  02/03/2024, Additional Findings: Tobacco Non-User  Non-smoker for personal reasons, Vaping  No. EXPOSURE TO PASSIVE SMOKE: no. Alcohol: no. Caffeine: yes, 1 serving daily. Recreational drug use: no. DIET: regular. Exercise: no. Marital Status: Divorced. Children: none. OCCUPATION: disability. Gyn History Sexual activity not currently sexually active.  Periods : postmenopausal.  LMP over 10 years ago.  Denies H/O Birth control.  Last pap smear date 01/2022 ASCUS, HRHPV neg.  Last mammogram date 11/25/2023 negative.  H/O Abnormal pap smear 01/2022.  Denies H/O STD.  OB History Never been pregnant  per patient.  Allergies Hydrocodone-Acetaminophen: HA (takes meds anyway) - Side Effects BuPROPion HCl: diarrhea - Side Effects Hospitalization/Major Diagnostic Procedure  Denies Past Hospitalization Review of Systems    CONSTITUTIONAL:  Chills No.  Fatigue No.  Fever No.   Night sweats No.  Recent travel outside [place] No.  Sweats No.  Weight change No.     OPHTHALMOLOGY:  Blurring of vision no.  Change in vision no.  Double vision no.     ENT:  Dizziness no.  Nose bleeds no.  Sore throat no.  Teeth pain no.     ALLERGY:  Hives no.     CARDIOLOGY:  Chest pain no.  High blood pressure no.  Irregular heart beat no.  Leg edema no.  Palpitations no.     RESPIRATORY:  Shortness of breath no.  Cough no.  Wheezing no.     UROLOGY:  Pain with urination no.  Urinary urgency no.  Urinary frequency no.  Urinary incontinence no.  Difficulty urinating No.  Blood in urine No.     GASTROENTEROLOGY:  Abdominal pain no.  Appetite change no.  Bloating/belching no.  Blood in stool or on toilet paper no.  Change in bowel movements no.  Constipation no.  Diarrhea no.  Difficulty swallowing no.  Nausea no.     FEMALE REPRODUCTIVE:  Vulvar pain no.  Vulvar rash no.  Abnormal vaginal bleeding postmenopausal bleeding .  Breast pain no.  Nipple discharge no.  Pain with intercourse no.  Pelvic pain no.  Unusual vaginal discharge no.  Vaginal itching no.     MUSCULOSKELETAL:  Muscle aches no.     NEUROLOGY:  Headache no.  Tingling/numbness no.  Weakness no.     PSYCHOLOGY:  Depression no.  Anxiety no.  Nervousness no.  Sleep disturbances no.  Suicidal ideation no .     ENDOCRINOLOGY:  Excessive thirst no.  Excessive urination no.  Hair loss no.  Heat or cold intolerance no.     HEMATOLOGY/LYMPH:  Abnormal bleeding no.  Easy bruising no.  Swollen glands no.     DERMATOLOGY:  New/changing skin lesion no.  Rash no.  Sores no.  Vital Signs Wt: 275.4, Wt change: 0.2 lbs, Ht: 65.5, BMI: 45.13, Pulse sitting: 89, BP sitting: 115/79. Examination    General Examination: CONSTITUTIONAL:  alert, oriented, NAD.  SKIN:  moist, warm.  EYES:  Conjunctiva clear.  LUNGS:  good I:E efffort noted , clear to auscultation bilaterally.  HEART:  regular rate and rhythm.  ABDOMEN:  soft,  non-tender/non-distended, bowel sounds present.  FEMALE GENITOURINARY:  normal external genitalia, labia - unremarkable, vagina - pink moist mucosa, no lesions or abnormal discharge, cervix - no discharge or lesions or CMT, adnexa - no masses or tenderness, uterus - nontender and normal size on palpation.  EXTREMITIES:  no edema present.  PSYCH:  affect normal, good eye contact.  Physical Examination    Chaperone present:  Chaperone present  Debby Freiberg 02/03/2024 10:16:34 AM >, for pelvic exam.  Assessments 1. Postmenopausal bleeding - N95.0 (Primary)    2. Endometrial polyp - N84.0    Treatment 1. Postmenopausal bleeding        Notes: Planning hysteroscopy D/C possible polypectomy with myosure. Pt is advised she will be able to return home the same day if she is doing well. Discussed risks of hysteroscopy including but not limited to infection, bleeding, possible perforation of the uterus, with the need for further surgery. Pt advised to avoid NSAIDs (Aspirin, Aleve, Advil, Ibuprofen, Motrin) from now until surgery given risk of bleeding during surgery. She may take Tylenol for pain management.  She is advised to avoid eating or drinking starting midnight prior to surgery. Discussed post-surgery avoidance of driving for 24 hours or intercourse for 2 weeks after procedure.   2. Endometrial polyp        Notes: Planning hysteroscopy D/C possible polypectomy with myosure. Pt is advised she will be able to return home the same day if she is doing well. Discussed risks of hysteroscopy including but not limited to infection, bleeding, possible perforation of the uterus, with the need for further surgery. Pt advised to avoid NSAIDs (Aspirin, Aleve, Advil, Ibuprofen, Motrin) from now until surgery given risk of bleeding during surgery. She may take Tylenol for pain management. She is advised to avoid eating or drinking starting midnight prior to surgery. Discussed post-surgery avoidance of driving for 24  hours or intercourse for 2 weeks after procedure.

## 2024-02-18 NOTE — Anesthesia Preprocedure Evaluation (Signed)
Anesthesia Evaluation  Patient identified by MRN, date of birth, ID band Patient awake    Reviewed: Allergy & Precautions, NPO status , Patient's Chart, lab work & pertinent test results  History of Anesthesia Complications Negative for: history of anesthetic complications  Airway Mallampati: III  TM Distance: >3 FB Neck ROM: Full    Dental  (+) Dental Advisory Given   Pulmonary neg pulmonary ROS   Pulmonary exam normal breath sounds clear to auscultation       Cardiovascular hypertension (amlodipine-valsartan), Pt. on medications (-) angina (-) Past MI, (-) Cardiac Stents and (-) CABG (-) dysrhythmias  Rhythm:Regular Rate:Normal     Neuro/Psych  PSYCHIATRIC DISORDERS (PTSD) Anxiety Depression Bipolar Disorder   negative neurological ROS     GI/Hepatic negative GI ROS, Neg liver ROS,,,  Endo/Other  diabetes, Type 2, Oral Hypoglycemic AgentsHypothyroidism  Class 3 obesity  Renal/GU negative Renal ROS     Musculoskeletal   Abdominal  (+) + obese  Peds  Hematology negative hematology ROS (+)   Anesthesia Other Findings   Reproductive/Obstetrics                             Anesthesia Physical Anesthesia Plan  ASA: 3  Anesthesia Plan: General   Post-op Pain Management: Tylenol PO (pre-op)*   Induction: Intravenous  PONV Risk Score and Plan: 3 and Ondansetron, Dexamethasone and Treatment may vary due to age or medical condition  Airway Management Planned: LMA  Additional Equipment:   Intra-op Plan:   Post-operative Plan: Extubation in OR  Informed Consent: I have reviewed the patients History and Physical, chart, labs and discussed the procedure including the risks, benefits and alternatives for the proposed anesthesia with the patient or authorized representative who has indicated his/her understanding and acceptance.     Dental advisory given  Plan Discussed with: CRNA and  Anesthesiologist  Anesthesia Plan Comments: (Risks of general anesthesia discussed including, but not limited to, sore throat, hoarse voice, chipped/damaged teeth, injury to vocal cords, nausea and vomiting, allergic reactions, lung infection, heart attack, stroke, and death. All questions answered. )        Anesthesia Quick Evaluation

## 2024-02-19 ENCOUNTER — Other Ambulatory Visit: Payer: Self-pay

## 2024-02-19 ENCOUNTER — Ambulatory Visit (HOSPITAL_BASED_OUTPATIENT_CLINIC_OR_DEPARTMENT_OTHER): Payer: Medicare Other | Admitting: Anesthesiology

## 2024-02-19 ENCOUNTER — Ambulatory Visit (HOSPITAL_COMMUNITY)
Admission: RE | Admit: 2024-02-19 | Discharge: 2024-02-19 | Disposition: A | Discharge status: Home / Self Care | Payer: Medicare Other | Attending: Obstetrics and Gynecology | Admitting: Obstetrics and Gynecology

## 2024-02-19 ENCOUNTER — Ambulatory Visit (HOSPITAL_COMMUNITY): Payer: Medicare Other | Admitting: Anesthesiology

## 2024-02-19 ENCOUNTER — Encounter (HOSPITAL_COMMUNITY): Payer: Self-pay | Admitting: Obstetrics and Gynecology

## 2024-02-19 ENCOUNTER — Encounter (HOSPITAL_COMMUNITY)
Admission: RE | Disposition: A | Discharge status: Home / Self Care | Payer: Self-pay | Source: Home / Self Care | Attending: Obstetrics and Gynecology

## 2024-02-19 DIAGNOSIS — N950 Postmenopausal bleeding: Secondary | ICD-10-CM | POA: Diagnosis not present

## 2024-02-19 DIAGNOSIS — I10 Essential (primary) hypertension: Secondary | ICD-10-CM | POA: Diagnosis not present

## 2024-02-19 DIAGNOSIS — F319 Bipolar disorder, unspecified: Secondary | ICD-10-CM | POA: Diagnosis not present

## 2024-02-19 DIAGNOSIS — Z7984 Long term (current) use of oral hypoglycemic drugs: Secondary | ICD-10-CM | POA: Diagnosis not present

## 2024-02-19 DIAGNOSIS — F418 Other specified anxiety disorders: Secondary | ICD-10-CM | POA: Insufficient documentation

## 2024-02-19 DIAGNOSIS — E119 Type 2 diabetes mellitus without complications: Secondary | ICD-10-CM | POA: Diagnosis not present

## 2024-02-19 DIAGNOSIS — Z6841 Body Mass Index (BMI) 40.0 and over, adult: Secondary | ICD-10-CM | POA: Diagnosis not present

## 2024-02-19 DIAGNOSIS — E66813 Obesity, class 3: Secondary | ICD-10-CM | POA: Diagnosis not present

## 2024-02-19 DIAGNOSIS — E039 Hypothyroidism, unspecified: Secondary | ICD-10-CM | POA: Diagnosis not present

## 2024-02-19 DIAGNOSIS — Z79899 Other long term (current) drug therapy: Secondary | ICD-10-CM | POA: Diagnosis not present

## 2024-02-19 DIAGNOSIS — N840 Polyp of corpus uteri: Secondary | ICD-10-CM | POA: Diagnosis present

## 2024-02-19 HISTORY — DX: Type 2 diabetes mellitus without complications: E11.9

## 2024-02-19 HISTORY — PX: DILATATION & CURETTAGE/HYSTEROSCOPY WITH MYOSURE: SHX6511

## 2024-02-19 HISTORY — DX: Bipolar disorder, unspecified: F31.9

## 2024-02-19 HISTORY — DX: Post-traumatic stress disorder, unspecified: F43.10

## 2024-02-19 LAB — BASIC METABOLIC PANEL
Anion gap: 15 (ref 5–15)
BUN: 13 mg/dL (ref 6–20)
CO2: 21 mmol/L — ABNORMAL LOW (ref 22–32)
Calcium: 9.4 mg/dL (ref 8.9–10.3)
Chloride: 103 mmol/L (ref 98–111)
Creatinine, Ser: 0.75 mg/dL (ref 0.44–1.00)
GFR, Estimated: >60 mL/min (ref >60)
Glucose, Bld: 149 mg/dL — ABNORMAL HIGH (ref 70–99)
Potassium: 4 mmol/L (ref 3.5–5.1)
Sodium: 139 mmol/L (ref 135–145)

## 2024-02-19 LAB — CBC
HCT: 41.8 % (ref 36.0–46.0)
Hemoglobin: 13.5 g/dL (ref 12.0–15.0)
MCH: 28.3 pg (ref 26.0–34.0)
MCHC: 32.3 g/dL (ref 30.0–36.0)
MCV: 87.6 fL (ref 80.0–100.0)
Platelets: 377 10*3/uL (ref 150–400)
RBC: 4.77 MIL/uL (ref 3.87–5.11)
RDW: 13.3 % (ref 11.5–15.5)
WBC: 11.8 10*3/uL — ABNORMAL HIGH (ref 4.0–10.5)
nRBC: 0 % (ref 0.0–0.2)

## 2024-02-19 LAB — GLUCOSE, CAPILLARY
Glucose-Capillary: 160 mg/dL — ABNORMAL HIGH (ref 70–99)
Glucose-Capillary: 154 mg/dL — ABNORMAL HIGH (ref 70–99)
Glucose-Capillary: 133 mg/dL — ABNORMAL HIGH (ref 70–99)

## 2024-02-19 SURGERY — DILATATION & CURETTAGE/HYSTEROSCOPY WITH MYOSURE
Anesthesia: General | Site: Uterus

## 2024-02-19 MED ORDER — BUPIVACAINE HCL (PF) 0.25 % IJ SOLN
INTRAMUSCULAR | Status: AC
Start: 2024-02-19 — End: ?
  Filled 2024-02-19: qty 30

## 2024-02-19 MED ORDER — PHENYLEPHRINE 80 MCG/ML (10ML) SYRINGE FOR IV PUSH (FOR BLOOD PRESSURE SUPPORT)
PREFILLED_SYRINGE | INTRAVENOUS | Status: AC
  Filled 2024-02-19: qty 10

## 2024-02-19 MED ORDER — DEXAMETHASONE SODIUM PHOSPHATE 10 MG/ML IJ SOLN
INTRAMUSCULAR | Status: DC | PRN
  Administered 2024-02-19: 5 mg via INTRAVENOUS

## 2024-02-19 MED ORDER — MIDAZOLAM HCL 5 MG/5ML IJ SOLN
INTRAMUSCULAR | Status: DC | PRN
Start: 2024-02-19 — End: 2024-02-19
  Administered 2024-02-19: 2 mg via INTRAVENOUS

## 2024-02-19 MED ORDER — FENTANYL CITRATE (PF) 100 MCG/2ML IJ SOLN
INTRAMUSCULAR | Status: DC | PRN
  Administered 2024-02-19 (×2): 50 ug via INTRAVENOUS

## 2024-02-19 MED ORDER — INSULIN ASPART 100 UNIT/ML IJ SOLN: 0.0000 [IU] | INTRAMUSCULAR | Status: DC | PRN

## 2024-02-19 MED ORDER — PROPOFOL 10 MG/ML IV BOLUS
INTRAVENOUS | Status: AC
  Filled 2024-02-19: qty 20

## 2024-02-19 MED ORDER — ONDANSETRON HCL 4 MG/2ML IJ SOLN
INTRAMUSCULAR | Status: DC | PRN
  Administered 2024-02-19: 4 mg via INTRAVENOUS

## 2024-02-19 MED ORDER — DEXAMETHASONE SODIUM PHOSPHATE 10 MG/ML IJ SOLN
INTRAMUSCULAR | Status: AC
  Filled 2024-02-19: qty 1

## 2024-02-19 MED ORDER — SODIUM CHLORIDE 0.9 % IR SOLN
Status: DC | PRN
  Administered 2024-02-19: 1000 mL

## 2024-02-19 MED ORDER — POVIDONE-IODINE 10 % EX SWAB
2.0000 | Freq: Once | CUTANEOUS | Status: AC
  Administered 2024-02-19: 2 via TOPICAL

## 2024-02-19 MED ORDER — OXYCODONE HCL 5 MG PO TABS: 5.0000 mg | ORAL_TABLET | Freq: Four times a day (QID) | ORAL | 0 refills | Status: AC | PRN

## 2024-02-19 MED ORDER — PHENYLEPHRINE 80 MCG/ML (10ML) SYRINGE FOR IV PUSH (FOR BLOOD PRESSURE SUPPORT)
PREFILLED_SYRINGE | INTRAVENOUS | Status: DC | PRN
Start: 2024-02-19 — End: 2024-02-19
  Administered 2024-02-19 (×2): 160 ug via INTRAVENOUS

## 2024-02-19 MED ORDER — ONDANSETRON HCL 4 MG/2ML IJ SOLN
INTRAMUSCULAR | Status: AC
  Filled 2024-02-19: qty 2

## 2024-02-19 MED ORDER — KETOROLAC TROMETHAMINE 30 MG/ML IJ SOLN
INTRAMUSCULAR | Status: AC
  Filled 2024-02-19: qty 1

## 2024-02-19 MED ORDER — KETOROLAC TROMETHAMINE 30 MG/ML IJ SOLN
INTRAMUSCULAR | Status: DC | PRN
  Administered 2024-02-19: 30 mg via INTRAVENOUS

## 2024-02-19 MED ORDER — FENTANYL CITRATE (PF) 250 MCG/5ML IJ SOLN
INTRAMUSCULAR | Status: AC
  Filled 2024-02-19: qty 5

## 2024-02-19 MED ORDER — SCOPOLAMINE 1 MG/3DAYS TD PT72
MEDICATED_PATCH | TRANSDERMAL | Status: DC | PRN
  Administered 2024-02-19: 1 via TRANSDERMAL

## 2024-02-19 MED ORDER — EPHEDRINE SULFATE-NACL 50-0.9 MG/10ML-% IV SOSY
PREFILLED_SYRINGE | INTRAVENOUS | Status: DC | PRN
  Administered 2024-02-19: 5 mg via INTRAVENOUS
  Administered 2024-02-19: 10 mg via INTRAVENOUS

## 2024-02-19 MED ORDER — DEXMEDETOMIDINE HCL IN NACL 80 MCG/20ML IV SOLN
INTRAVENOUS | Status: DC | PRN
  Administered 2024-02-19: 12 ug via INTRAVENOUS

## 2024-02-19 MED ORDER — MIDAZOLAM HCL 2 MG/2ML IJ SOLN
INTRAMUSCULAR | Status: AC
  Filled 2024-02-19: qty 2

## 2024-02-19 MED ORDER — LIDOCAINE 2% (20 MG/ML) 5 ML SYRINGE
INTRAMUSCULAR | Status: DC | PRN
  Administered 2024-02-19: 100 mg via INTRAVENOUS

## 2024-02-19 MED ORDER — OXYCODONE HCL 5 MG/5ML PO SOLN: 5.0000 mg | Freq: Once | ORAL | Status: DC | PRN

## 2024-02-19 MED ORDER — SCOPOLAMINE 1 MG/3DAYS TD PT72
MEDICATED_PATCH | TRANSDERMAL | Status: AC
  Filled 2024-02-19: qty 1

## 2024-02-19 MED ORDER — SILVER NITRATE-POT NITRATE 75-25 % EX MISC
CUTANEOUS | Status: AC
  Filled 2024-02-19: qty 10

## 2024-02-19 MED ORDER — FENTANYL CITRATE (PF) 100 MCG/2ML IJ SOLN: 25.0000 ug | INTRAMUSCULAR | Status: DC | PRN

## 2024-02-19 MED ORDER — SILVER NITRATE-POT NITRATE 75-25 % EX MISC
CUTANEOUS | Status: DC | PRN
  Administered 2024-02-19: 2

## 2024-02-19 MED ORDER — ACETAMINOPHEN 500 MG PO TABS: 500.0000 mg | ORAL_TABLET | Freq: Three times a day (TID) | ORAL | 0 refills | Status: AC | PRN

## 2024-02-19 MED ORDER — CHLORHEXIDINE GLUCONATE 0.12 % MT SOLN
15.0000 mL | Freq: Once | OROMUCOSAL | Status: AC
  Administered 2024-02-19: 15 mL via OROMUCOSAL
  Filled 2024-02-19: qty 15

## 2024-02-19 MED ORDER — BUPIVACAINE HCL (PF) 0.25 % IJ SOLN
INTRAMUSCULAR | Status: DC | PRN
  Administered 2024-02-19: 20 mL

## 2024-02-19 MED ORDER — PROPOFOL 10 MG/ML IV BOLUS
INTRAVENOUS | Status: DC | PRN
  Administered 2024-02-19: 200 mg via INTRAVENOUS
  Administered 2024-02-19: 20 mg via INTRAVENOUS

## 2024-02-19 MED ORDER — LIDOCAINE 2% (20 MG/ML) 5 ML SYRINGE
INTRAMUSCULAR | Status: AC
  Filled 2024-02-19: qty 5

## 2024-02-19 MED ORDER — LACTATED RINGERS IV SOLN: INTRAVENOUS | Status: DC

## 2024-02-19 MED ORDER — ACETAMINOPHEN 500 MG PO TABS
1000.0000 mg | ORAL_TABLET | ORAL | Status: AC
  Administered 2024-02-19: 1000 mg via ORAL
  Filled 2024-02-19: qty 2

## 2024-02-19 MED ORDER — ORAL CARE MOUTH RINSE: 15.0000 mL | Freq: Once | OROMUCOSAL | Status: AC

## 2024-02-19 MED ORDER — EPHEDRINE 5 MG/ML INJ
INTRAVENOUS | Status: AC
  Filled 2024-02-19: qty 5

## 2024-02-19 MED ORDER — OXYCODONE HCL 5 MG PO TABS: 5.0000 mg | ORAL_TABLET | Freq: Once | ORAL | Status: DC | PRN

## 2024-02-19 MED ORDER — AMISULPRIDE (ANTIEMETIC) 5 MG/2ML IV SOLN: 10.0000 mg | Freq: Once | INTRAVENOUS | Status: DC | PRN

## 2024-02-19 MED ORDER — IBUPROFEN 800 MG PO TABS: 800.0000 mg | ORAL_TABLET | Freq: Three times a day (TID) | ORAL | 0 refills | Status: AC | PRN

## 2024-02-19 SURGICAL SUPPLY — 16 items
CANISTER SUCT 3000ML PPV (MISCELLANEOUS) ×2 IMPLANT
CATH ROBINSON RED A/P 16FR (CATHETERS) ×2 IMPLANT
DEVICE MYOSURE LITE (MISCELLANEOUS) IMPLANT
DEVICE MYOSURE REACH (MISCELLANEOUS) IMPLANT
DILATOR CANAL MILEX (MISCELLANEOUS) IMPLANT
GLOVE BIOGEL PI IND STRL 6.5 (GLOVE) ×2 IMPLANT
GLOVE SURG ENC TEXT LTX SZ6.5 (GLOVE) ×2 IMPLANT
GLOVE SURG UNDER POLY LF SZ7 (GLOVE) ×2 IMPLANT
GOWN STRL REUS W/ TWL LRG LVL3 (GOWN DISPOSABLE) ×4 IMPLANT
KIT PROCEDURE FLUENT (KITS) ×2 IMPLANT
KIT TURNOVER KIT B (KITS) ×2 IMPLANT
PACK VAGINAL MINOR WOMEN LF (CUSTOM PROCEDURE TRAY) ×2 IMPLANT
PAD OB MATERNITY 4.3X12.25 (PERSONAL CARE ITEMS) ×2 IMPLANT
SEAL ROD LENS SCOPE MYOSURE (ABLATOR) ×2 IMPLANT
TOWEL GREEN STERILE FF (TOWEL DISPOSABLE) ×4 IMPLANT
UNDERPAD 30X36 HEAVY ABSORB (UNDERPADS AND DIAPERS) ×2 IMPLANT

## 2024-02-19 NOTE — Transfer of Care (Signed)
Immediate Anesthesia Transfer of Care Note  Patient: Loretta Wu  Procedure(s) Performed: DILATATION & CURETTAGE/HYSTEROSCOPY  POLYPECTOMY WITH MYOSURE (Uterus)  Patient Location: PACU  Anesthesia Type:General  Level of Consciousness: awake, alert , oriented, and patient cooperative  Airway & Oxygen Therapy: Patient Spontanous Breathing  Post-op Assessment: Report given to RN and Post -op Vital signs reviewed and stable  Post vital signs: Reviewed and stable  Last Vitals:  Vitals Value Taken Time  BP 119/56 02/19/24 1054  Temp    Pulse 93 02/19/24 1059  Resp 19 02/19/24 1059  SpO2 94 % 02/19/24 1059  Vitals shown include unfiled device data.  Last Pain:  Vitals:   02/19/24 0757  TempSrc: Oral  PainSc: 0-No pain         Complications: No notable events documented.

## 2024-02-19 NOTE — Anesthesia Procedure Notes (Signed)
Procedure Name: LMA Insertion Date/Time: 02/19/2024 10:06 AM  Performed by: Bishop Limbo, CRNAPre-anesthesia Checklist: Patient identified, Emergency Drugs available, Suction available and Patient being monitored Patient Re-evaluated:Patient Re-evaluated prior to induction Oxygen Delivery Method: Circle System Utilized Preoxygenation: Pre-oxygenation with 100% oxygen Induction Type: IV induction Ventilation: Mask ventilation without difficulty LMA: LMA inserted LMA Size: 4.0 Number of attempts: 1 Placement Confirmation: positive ETCO2 Tube secured with: Tape Dental Injury: Teeth and Oropharynx as per pre-operative assessment

## 2024-02-19 NOTE — Op Note (Signed)
02/19/2024  11:43 AM  PATIENT:  Loretta Wu  63 y.o. female  PRE-OPERATIVE DIAGNOSIS:  Endometrial polyp Postmenopausal Bleeding  POST-OPERATIVE DIAGNOSIS:  Endometrial polypPostmenopausal Bleeding  PROCEDURE:  Procedure(s) with comments: DILATATION & CURETTAGE/HYSTEROSCOPY  POLYPECTOMY WITH MYOSURE (N/A) - rep to cover case confirmed on 2/18 CS  SURGEON:  Surgeons and Role:    Gerald Leitz, MD - Primary  PHYSICIAN ASSISTANT:   ASSISTANTS: none   ANESTHESIA:   general  EBL:  5 mL   BLOOD ADMINISTERED:none  DRAINS: none   LOCAL MEDICATIONS USED:  MARCAINE     SPECIMEN:  Source of Specimen:  Endometrial currettings and mass  DISPOSITION OF SPECIMEN:  PATHOLOGY  COUNTS:  YES  TOURNIQUET:  * No tourniquets in log *  DICTATION: .Note written in EPIC  PLAN OF CARE: Discharge to home after PACU  PATIENT DISPOSITION:  PACU - hemodynamically stable.   Delay start of Pharmacological VTE agent (>24hrs) due to surgical blood loss or risk of bleeding: not applicable  Findings: Normal external genitalia, vaginal mucosa and cervix. Multiple endometrial polyps.   Procedure: Patient was taken to the operating room #8 [facility]  where she was placed under general anesthesia. She was placed in the dorsal lithotomy position. She was prepped and draped in the usual sterile fashion. A speculum was placed into the vaginal vault. The anterior lip of the cervix was grasped with a single-tooth tenaculum. Quarter percent Marcaine was injected at the 4 and 8:00 positions of the cervix. The uterus  was then sounded to 7 cm. The cervix  was dilated to approximately 6 mm. Mysosure operative  hysteroscope was inserted. The findings noted above. Myosure reach  blade was introduced throught the hysteroscope. The endometrial mass  and currettings were obtained. There was no evidence of perforation. Hysteroscope was then removed.   The single-tooth tenaculum was removed from the anterior  lip of the cervix. Patient was noted to have bleeding from the tenaculum site. Silver nitrate was applied and excellent hemostasis was noted. The speculum was removed from the patient's vagina. She was awakened from anesthesia and taken To the recovery  room awake and in stable condition. Sponge lap and needle counts were correct x 2.

## 2024-02-19 NOTE — Anesthesia Postprocedure Evaluation (Signed)
Anesthesia Post Note  Patient: Loretta Wu  Procedure(s) Performed: DILATATION & CURETTAGE/HYSTEROSCOPY  POLYPECTOMY WITH MYOSURE (Uterus)     Patient location during evaluation: PACU Anesthesia Type: General Level of consciousness: awake Pain management: pain level controlled Vital Signs Assessment: post-procedure vital signs reviewed and stable Respiratory status: spontaneous breathing, nonlabored ventilation and respiratory function stable Cardiovascular status: blood pressure returned to baseline and stable Postop Assessment: no apparent nausea or vomiting Anesthetic complications: no   No notable events documented.  Last Vitals:  Vitals:   02/19/24 1115 02/19/24 1123  BP: 130/71 (!) 143/68  Pulse: 91 85  Resp: 15 14  Temp:  36.7 C  SpO2: 92% 93%    Last Pain:  Vitals:   02/19/24 1115  TempSrc:   PainSc: 0-No pain                 Linton Rump

## 2024-02-19 NOTE — H&P (Signed)
Date of Initial H&P: 02/18/2024  History reviewed, patient examined, no change in status, stable for surgery.

## 2024-02-20 ENCOUNTER — Encounter (HOSPITAL_COMMUNITY): Payer: Self-pay | Admitting: Obstetrics and Gynecology

## 2024-02-20 LAB — SURGICAL PATHOLOGY

## 2024-02-26 DIAGNOSIS — M5412 Radiculopathy, cervical region: Secondary | ICD-10-CM | POA: Diagnosis not present

## 2024-02-26 DIAGNOSIS — M791 Myalgia, unspecified site: Secondary | ICD-10-CM | POA: Diagnosis not present

## 2024-02-26 DIAGNOSIS — M542 Cervicalgia: Secondary | ICD-10-CM | POA: Diagnosis not present

## 2024-02-26 DIAGNOSIS — M5030 Other cervical disc degeneration, unspecified cervical region: Secondary | ICD-10-CM | POA: Diagnosis not present

## 2024-03-05 DIAGNOSIS — Z9889 Other specified postprocedural states: Secondary | ICD-10-CM | POA: Diagnosis not present

## 2024-06-01 DIAGNOSIS — I10 Essential (primary) hypertension: Secondary | ICD-10-CM | POA: Diagnosis not present

## 2024-06-01 DIAGNOSIS — E782 Mixed hyperlipidemia: Secondary | ICD-10-CM | POA: Diagnosis not present

## 2024-06-01 DIAGNOSIS — E119 Type 2 diabetes mellitus without complications: Secondary | ICD-10-CM | POA: Diagnosis not present

## 2024-06-01 DIAGNOSIS — E039 Hypothyroidism, unspecified: Secondary | ICD-10-CM | POA: Diagnosis not present

## 2024-06-03 ENCOUNTER — Other Ambulatory Visit (HOSPITAL_COMMUNITY)
Admission: RE | Admit: 2024-06-03 | Discharge: 2024-06-03 | Disposition: A | Discharge status: Home / Self Care | Source: Ambulatory Visit | Attending: Obstetrics and Gynecology | Admitting: Obstetrics and Gynecology

## 2024-06-03 DIAGNOSIS — Z124 Encounter for screening for malignant neoplasm of cervix: Secondary | ICD-10-CM | POA: Insufficient documentation

## 2024-06-03 DIAGNOSIS — L309 Dermatitis, unspecified: Secondary | ICD-10-CM | POA: Diagnosis not present

## 2024-06-03 DIAGNOSIS — Z1151 Encounter for screening for human papillomavirus (HPV): Secondary | ICD-10-CM | POA: Insufficient documentation

## 2024-06-03 DIAGNOSIS — B3789 Other sites of candidiasis: Secondary | ICD-10-CM | POA: Diagnosis not present

## 2024-06-03 DIAGNOSIS — Z01419 Encounter for gynecological examination (general) (routine) without abnormal findings: Secondary | ICD-10-CM | POA: Diagnosis present

## 2024-06-03 DIAGNOSIS — N61 Mastitis without abscess: Secondary | ICD-10-CM | POA: Diagnosis not present

## 2024-06-12 LAB — CYTOLOGY - PAP
Comment: NEGATIVE
Diagnosis: NEGATIVE
High risk HPV: NEGATIVE

## 2024-06-18 DIAGNOSIS — H5213 Myopia, bilateral: Secondary | ICD-10-CM | POA: Diagnosis not present

## 2024-06-23 DIAGNOSIS — L720 Epidermal cyst: Secondary | ICD-10-CM | POA: Diagnosis not present

## 2024-06-24 DIAGNOSIS — M5412 Radiculopathy, cervical region: Secondary | ICD-10-CM | POA: Diagnosis not present

## 2024-06-24 DIAGNOSIS — G894 Chronic pain syndrome: Secondary | ICD-10-CM | POA: Diagnosis not present

## 2024-06-24 DIAGNOSIS — M5416 Radiculopathy, lumbar region: Secondary | ICD-10-CM | POA: Diagnosis not present

## 2024-11-24 ENCOUNTER — Encounter: Payer: Self-pay | Admitting: *Deleted

## 2024-11-24 NOTE — Progress Notes (Signed)
 Loretta Wu                                          MRN: [id number]   11/24/2024   The [facility] Quality Team Specialist reviewed this patient medical record for the purposes of chart review for care gap closure. The following were reviewed: chart review for care gap closure-kidney health evaluation for diabetes:eGFR  and uACR.    [facility] Quality Team
